# Patient Record
Sex: Male | Born: 1970
Health system: Southern US, Community
[De-identification: ages and names within clinical notes are randomized; demographics above are authoritative.]

## PROBLEM LIST (undated history)

## (undated) DIAGNOSIS — T7840XA Allergy, unspecified, initial encounter: Secondary | ICD-10-CM

## (undated) DIAGNOSIS — J309 Allergic rhinitis, unspecified: Secondary | ICD-10-CM

## (undated) DIAGNOSIS — I10 Essential (primary) hypertension: Secondary | ICD-10-CM

## (undated) HISTORY — DX: Allergy, unspecified, initial encounter: T78.40XA

## (undated) HISTORY — DX: Essential (primary) hypertension: I10

## (undated) HISTORY — DX: Allergic rhinitis, unspecified: J30.9

---

## 2012-02-04 ENCOUNTER — Ambulatory Visit (INDEPENDENT_AMBULATORY_CARE_PROVIDER_SITE_OTHER): Payer: BC Managed Care – PPO | Admitting: Physician Assistant

## 2012-02-04 ENCOUNTER — Encounter: Payer: Self-pay | Admitting: Physician Assistant

## 2012-02-04 VITALS — BP 141/85 | HR 86 | Temp 98.1°F | Resp 16 | Ht 70.0 in | Wt 177.0 lb

## 2012-02-04 DIAGNOSIS — Z0000 Encounter for general adult medical examination without abnormal findings: Secondary | ICD-10-CM

## 2012-02-04 LAB — COMPREHENSIVE METABOLIC PANEL
ALT: 15 U/L (ref 0–53)
AST: 18 U/L (ref 0–37)
Albumin: 4.3 g/dL (ref 3.5–5.2)
Alkaline Phosphatase: 81 U/L (ref 39–117)
Calcium: 8.6 mg/dL (ref 8.4–10.5)
Chloride: 105 mEq/L (ref 96–112)
Potassium: 4.3 mEq/L (ref 3.5–5.3)
Sodium: 141 mEq/L (ref 135–145)

## 2012-02-04 LAB — POCT URINALYSIS DIPSTICK
Bilirubin, UA: NEGATIVE
Ketones, UA: NEGATIVE
Leukocytes, UA: NEGATIVE
Nitrite, UA: NEGATIVE
Protein, UA: NEGATIVE
pH, UA: 7

## 2012-02-04 LAB — POCT UA - MICROSCOPIC ONLY
Crystals, Ur, HPF, POC: NEGATIVE
RBC, urine, microscopic: NEGATIVE
WBC, Ur, HPF, POC: NEGATIVE
Yeast, UA: NEGATIVE

## 2012-02-04 LAB — LIPID PANEL
LDL Cholesterol: 98 mg/dL (ref 0–99)
VLDL: 36 mg/dL (ref 0–40)

## 2012-02-04 LAB — TSH: TSH: 2.487 u[IU]/mL (ref 0.350–4.500)

## 2012-02-04 NOTE — Progress Notes (Signed)
Patient ID: Danny Garza MRN: [id number], DOB: [birth year] 41 y.o. Date of Encounter: 02/04/2012, 3:37 PM  Primary Physician: No primary provider on file.  Chief Complaint: Physical (CPE)  HPI: 41 y.o. y/o male with history noted below here for CPE.  Doing well. No issues/complaints. Last CPE years ago.  See scanned in blue sheet. Mentions that his BP has slowly been increasing. He was at his dentist a month or two ago and was told that his BP was 130's/90. Today he comes in with a triage BP in the 140's/80's. No CP, vision changes, or focal deficits. He does eat a lot of sauces and cold cut meats that may contribute some to this. Lately he has not had time to exercise secondary to his busy school schedule. He does plan to start exercising again to both help with his BP and secondary to having more time outside of work.  Last tetanus 2007, declines update today. He does mention a slight increase in his allergies over the past week or two. This has caused a mild HA that is resolved with Tylenol or Motrin. He plans to start a daily Zyrtec to help.  Review of Systems: Consitutional: No fever, chills, fatigue, night sweats, lymphadenopathy, or weight changes. Eyes: No visual changes, eye redness, or discharge. ENT/Mouth: Ears: No otalgia, tinnitus, hearing loss, discharge. Nose: No congestion, rhinorrhea, sinus pain, or epistaxis. Throat: No sore throat, post nasal drip, or teeth pain. Cardiovascular: No CP, palpitations, diaphoresis, DOE, edema, orthopnea, PND. Respiratory: No cough, hemoptysis, SOB, or wheezing. Gastrointestinal: No anorexia, dysphagia, reflux, pain, nausea, vomiting, hematemesis, diarrhea, constipation, BRBPR, or melena. Genitourinary: No dysuria, frequency, urgency, hematuria, incontinence, nocturia, decreased urinary stream, discharge, impotence, or testicular pain/masses. Musculoskeletal: No decreased ROM, myalgias, stiffness, joint swelling, or weakness. Skin: No rash,  erythema, lesion changes, pain, warmth, jaundice, or pruritis. Neurological: No headache, dizziness, syncope, seizures, tremors, memory loss, coordination problems, or paresthesias. Psychological: No anxiety, depression, hallucinations, SI/HI. Endocrine: No fatigue, polydipsia, polyphagia, polyuria, or known diabetes. All other systems were reviewed and are otherwise negative.  History reviewed. No pertinent past medical history.   History reviewed. No pertinent past surgical history.  Home Meds:  Prior to Admission medications   Not on File    Allergies: No Known Allergies  History   Social History  . Marital Status: No    Spouse Name: N/A    Number of Children: 0  . Years of Education: Other   Occupational History  . Not on file.   Social History Main Topics  . Smoking status: Never Smoker   . Smokeless tobacco: Never Used  . Alcohol Use: 0.5 oz/week    1 drink(s) per week  . Drug Use: No  . Sexually Active: Not Currently   Other Topics Concern  . Not on file   Social History Narrative  . No narrative on file    Family History  Problem Relation Age of Onset  . Cancer Father     throat secondary to tobacco use  . Hypertension Maternal Grandmother     Physical Exam: Blood pressure 141/85, pulse 86, temperature 98.1 F (36.7 C), temperature source Oral, resp. rate 16, height 5\' 10"  (1.778 m), weight 177 lb (80.287 kg). BP recheck 136/88 General: Well developed, well nourished, in no acute distress. HEENT: Normocephalic, atraumatic. Conjunctiva pink, sclera non-icteric. Pupils 2 mm constricting to 1 mm, round, regular, and equally reactive to light and accomodation. EOMI. Internal auditory canal clear. TMs with good cone of light  and without pathology. Nasal mucosa pink. Nares are without discharge. No sinus tenderness. Oral mucosa pink. Dentition with braces. Pharynx without exudate.   Neck: Supple. Trachea midline. No thyromegaly. Full ROM. No  lymphadenopathy. Lungs: Clear to auscultation bilaterally without wheezes, rales, or rhonchi. Breathing is of normal effort and unlabored. Cardiovascular: RRR with S1 S2. No murmurs, rubs, or gallops appreciated. Distal pulses 2+ symmetrically. No carotid or abdominal bruits. Abdomen: Soft, non-tender, non-distended with normoactive bowel sounds. No hepatosplenomegaly or masses. No rebound/guarding. No CVA tenderness. Without hernias.  Rectal: No external hemorrhoids or fissures. Rectal vault without masses. Prostate symmetrical without nodules.  Genitourinary: Circumcised male. No penile lesions. Testes descended bilaterally, and smooth without tenderness or masses.  Musculoskeletal: Full range of motion and 5/5 strength throughout. Without swelling, atrophy, tenderness, crepitus, or warmth. Extremities without clubbing, cyanosis, or edema. Calves supple. Skin: Warm and moist without erythema, ecchymosis, wounds, or rash. Neuro: A+Ox3. CN II-XII grossly intact. Moves all extremities spontaneously. Full sensation throughout. Normal gait. DTR 2+ throughout upper and lower extremities. Finger to nose intact. Psych:  Responds to questions appropriately with a normal affect.   Studies: CBC, CMET, Lipid, PSA, TSH, Vitamin D all pending. Patient is non-fasting. Results for orders placed in visit on 02/04/12  POCT UA - MICROSCOPIC ONLY      Component Value Range   WBC, Ur, HPF, POC neg     RBC, urine, microscopic neg     Bacteria, U Microscopic neg     Mucus, UA neg     Epithelial cells, urine per micros neg     Crystals, Ur, HPF, POC neg     Casts, Ur, LPF, POC neg     Yeast, UA neg    POCT URINALYSIS DIPSTICK      Component Value Range   Color, UA yellow     Clarity, UA clear     Glucose, UA neg     Bilirubin, UA neg     Ketones, UA neg     Spec Grav, UA 1.020     Blood, UA neg     pH, UA 7.0     Protein, UA neg     Urobilinogen, UA 1.0     Nitrite, UA neg     Leukocytes, UA Negative        Assessment/Plan:  41 y.o. y/o Caucasian male here for CPE with borderline HTN and allergic rhinitis 1. Borderline HTN -Patient will watch closely for the next 1-2 months -Work on improving diet and exercise -Recheck 1-2 months, if still elevated plan to start low lost HCTZ or Lisinopril  -Risks of HTN discussed in detail with patient today  2. CPE -Tetanus up to date -Await labs  3. Allergic rhinitis -Start Zyrtec daily -Recheck along with HTN 1-2 months -May need to add daily Flonase  Signed, Eula Listen, PA-C 02/04/2012 3:37 PM

## 2012-02-05 LAB — CBC
Platelets: 298 10*3/uL (ref 150–400)
RDW: 13.6 % (ref 11.5–15.5)
WBC: 6.7 10*3/uL (ref 4.0–10.5)

## 2012-04-07 ENCOUNTER — Encounter: Payer: Self-pay | Admitting: Physician Assistant

## 2012-04-07 ENCOUNTER — Ambulatory Visit (INDEPENDENT_AMBULATORY_CARE_PROVIDER_SITE_OTHER): Payer: BC Managed Care – PPO | Admitting: Physician Assistant

## 2012-04-07 VITALS — BP 141/96 | HR 69 | Temp 97.6°F | Resp 16 | Ht 70.0 in | Wt 177.0 lb

## 2012-04-07 DIAGNOSIS — R030 Elevated blood-pressure reading, without diagnosis of hypertension: Secondary | ICD-10-CM

## 2012-04-07 NOTE — Progress Notes (Signed)
Patient ID: Danny Garza MRN: [id number], DOB: 30-Nov-1970, 41 y.o. Date of Encounter: 04/07/2012, 1:01 PM  Primary Physician: No primary provider on file.  Chief Complaint: HTN  HPI: 41 y.o. year old male with history below presents for elevated blood pressure follow up. Doing well. No issues or complaints. Working out at [employer] three times per week. Eating healthier. Making his own sauces. Has cut out cold cuts. No sodas. [place] fries are his weakness. No CP, HA, visual changes, or focal deficits. His grandparents have history of HTN, but not his parents. At this time he would still like hold off antihypertensives and attempt to control his BP with lifestyle changes.  No past medical history on file.   Home Meds: Prior to Admission medications   Medication Sig Start Date End Date Taking? Authorizing Provider  cetirizine (ZYRTEC) 10 MG tablet Take 10 mg by mouth daily.   Yes Historical Provider, MD    Allergies: No Known Allergies  History   Social History  . Marital Status: Unknown    Spouse Name: N/A    Number of Children: N/A  . Years of Education: N/A   Occupational History  . Not on file.   Social History Main Topics  . Smoking status: Never Smoker   . Smokeless tobacco: Never Used  . Alcohol Use: 0.5 oz/week    1 drink(s) per week  . Drug Use: No  . Sexually Active: Not Currently   Other Topics Concern  . Not on file   Social History Narrative  . No narrative on file     Family History  Problem Relation Age of Onset  . Cancer Father     throat secondary to tobacco use  . Hypertension Maternal Grandmother     Review of Systems: Constitutional: negative for chills, fever, night sweats, weight changes, or fatigue  HEENT: negative for vision changes, hearing loss, congestion, rhinorrhea, ST, epistaxis, or sinus pressure Cardiovascular: negative for chest pain, palpitations, or DOE Respiratory: negative for hemoptysis, wheezing, shortness of breath, or  cough Abdominal: negative for abdominal pain, nausea, vomiting, diarrhea, or constipation Dermatological: negative for rash Neurologic: negative for headache, dizziness, or syncope All other systems reviewed and are otherwise negative with the exception to those above and in the HPI.   Physical Exam: Blood pressure 141/96, pulse 69, temperature 97.6 F (36.4 C), temperature source Oral, resp. rate 16, height 5\' 10"  (1.778 m), weight 177 lb (80.287 kg)., Body mass index is 25.40 kg/(m^2). General: Well developed, well nourished, in no acute distress. Head: Normocephalic, atraumatic, eyes without discharge, sclera non-icteric, nares are without discharge. Bilateral auditory canals clear, TM's are without perforation, pearly grey and translucent with reflective cone of light bilaterally. Oral cavity moist, posterior pharynx without exudate, erythema, peritonsillar abscess, or post nasal drip.  Neck: Supple. No thyromegaly. Full ROM. No lymphadenopathy. No carotid bruits. Lungs: Clear bilaterally to auscultation without wheezes, rales, or rhonchi. Breathing is unlabored. Heart: RRR with S1 S2. No murmurs, rubs, or gallops appreciated.  Abdomen: Soft, non-tender, non-distended with normoactive bowel sounds. No hepatosplenomegaly. No rebound/guarding. No obvious abdominal masses. Msk:  Strength and tone normal for age. Extremities/Skin: Warm and dry. No clubbing or cyanosis. No edema. No rashes or suspicious lesions. Distal pulses 2+ and equal bilaterally. Neuro: Alert and oriented X 3. Moves all extremities spontaneously. Gait is normal. CNII-XII grossly in tact. DTR 2+, cerebellar function intact. Rhomberg normal. Psych:  Responds to questions appropriately with a normal affect.    ASSESSMENT  AND PLAN:  41 y.o. year old male with elevated BP. -Continue lifestyle changes -Declines antihypertensive treatment at this time -Healthy diet and exercise -Cut out french fries -Recheck 3-4 months -If  BP remains the same needs to start antihypertensives  Signed, Eula Listen, PA-C 04/07/2012 1:01 PM

## 2012-07-21 ENCOUNTER — Encounter: Payer: Self-pay | Admitting: Physician Assistant

## 2012-07-21 ENCOUNTER — Ambulatory Visit (INDEPENDENT_AMBULATORY_CARE_PROVIDER_SITE_OTHER): Payer: BC Managed Care – PPO | Admitting: Physician Assistant

## 2012-07-21 VITALS — BP 124/94 | HR 77 | Temp 98.2°F | Resp 16 | Ht 71.0 in | Wt 176.8 lb

## 2012-07-21 DIAGNOSIS — J309 Allergic rhinitis, unspecified: Secondary | ICD-10-CM

## 2012-07-21 DIAGNOSIS — I10 Essential (primary) hypertension: Secondary | ICD-10-CM

## 2012-07-21 MED ORDER — HYDROCHLOROTHIAZIDE 12.5 MG PO CAPS: 12.5000 mg | ORAL_CAPSULE | ORAL | Status: DC

## 2012-07-21 NOTE — Progress Notes (Signed)
Patient ID: Danny Garza MRN: [id number], DOB: 1970/11/14, 41 y.o. Date of Encounter: 07/21/2012, 1:25 PM  Primary Physician: No primary provider on file.  Chief Complaint: HTN  HPI: 41 y.o. year old male with history below presents for hypertension follow up. See previous office visits. Doing well. No issues or complaints. Has continued to eat healthy. Not exercising as much, as school is back now. Has cut down on his french fries. No CP, HA, visual changes, or focal deficits. Seems to be a little more open to starting BP medication today as his BP is remaining about the same over these visits.     Also mentions that Zyrtec seems to keep him awake when he takes this. Does not take this daily, only prn.      No past medical history on file.   Home Meds: Prior to Admission medications   Medication Sig Start Date End Date Taking? Authorizing Provider  cetirizine (ZYRTEC) 10 MG tablet Take 10 mg by mouth daily.    Historical Provider, MD    Allergies: No Known Allergies  History   Social History  . Marital Status: Unknown    Spouse Name: N/A    Number of Children: N/A  . Years of Education: N/A   Occupational History  . Not on file.   Social History Main Topics  . Smoking status: Never Smoker   . Smokeless tobacco: Never Used  . Alcohol Use: 0.5 oz/week    1 drink(s) per week  . Drug Use: No  . Sexually Active: Not Currently   Other Topics Concern  . Not on file   Social History Narrative  . No narrative on file     Family History  Problem Relation Age of Onset  . Cancer Father     throat secondary to tobacco use  . Hypertension Maternal Grandmother     Review of Systems: Constitutional: negative for chills, fever, night sweats, weight changes, or fatigue  HEENT: negative for vision changes, hearing loss, congestion, rhinorrhea, ST, epistaxis, or sinus pressure Cardiovascular: negative for chest pain, palpitations, or DOE Respiratory: negative for  hemoptysis, wheezing, shortness of breath, or cough Abdominal: negative for abdominal pain, nausea, vomiting, diarrhea, or constipation Dermatological: negative for rash Neurologic: negative for headache, dizziness, or syncope All other systems reviewed and are otherwise negative with the exception to those above and in the HPI.   Physical Exam: Blood pressure 124/94, pulse 77, temperature 98.2 F (36.8 C), temperature source Oral, resp. rate 16, height 5\' 11"  (1.803 m), weight 176 lb 12.8 oz (80.196 kg), SpO2 98.00%., Body mass index is 24.66 kg/(m^2). General: Well developed, well nourished, in no acute distress. Head: Normocephalic, atraumatic, eyes without discharge, sclera non-icteric, nares are without discharge. Bilateral auditory canals clear, TM's are without perforation, pearly grey and translucent with reflective cone of light bilaterally. Oral cavity moist, posterior pharynx without exudate, erythema, peritonsillar abscess, or post nasal drip.  Neck: Supple. No thyromegaly. Full ROM. No lymphadenopathy. No carotid bruits. Lungs: Clear bilaterally to auscultation without wheezes, rales, or rhonchi. Breathing is unlabored. Heart: RRR with S1 S2. No murmurs, rubs, or gallops appreciated.  Msk:  Strength and tone normal for age. Extremities/Skin: Warm and dry. No clubbing or cyanosis. No edema. No rashes or suspicious lesions. Distal pulses 2+ and equal bilaterally. Neuro: Alert and oriented X 3. Moves all extremities spontaneously. Gait is normal. CNII-XII grossly in tact. DTR 2+, cerebellar function intact. Rhomberg normal. Psych:  Responds to questions appropriately with a  normal affect.     ASSESSMENT AND PLAN:  41 y.o. year old male with HTN and allergic rhinitis. 1. HTN -Start HCTZ 12.5 mg 1 po daily #30 RF 1 -Healthy diet and exercise -Weight loss -Likely some family history contributing here as well -Patient declines flu vaccine in office today, will get at  work. -Recheck one month, with labs  2. Allergic rhinitis -Stop Zyrtec -Trial of Claritin or Allegra prn -If unable to tolerate can call for Singulair  Signed, Eula Listen, PA-C 07/21/2012 1:25 PM

## 2012-08-25 ENCOUNTER — Encounter: Payer: Self-pay | Admitting: Physician Assistant

## 2012-08-25 ENCOUNTER — Ambulatory Visit (INDEPENDENT_AMBULATORY_CARE_PROVIDER_SITE_OTHER): Payer: BC Managed Care – PPO | Admitting: Physician Assistant

## 2012-08-25 VITALS — BP 120/82 | HR 79 | Temp 97.8°F | Resp 16 | Ht 70.0 in | Wt 173.0 lb

## 2012-08-25 DIAGNOSIS — I10 Essential (primary) hypertension: Secondary | ICD-10-CM

## 2012-08-25 DIAGNOSIS — J309 Allergic rhinitis, unspecified: Secondary | ICD-10-CM

## 2012-08-25 MED ORDER — FLUTICASONE PROPIONATE 50 MCG/ACT NA SUSP: 2.0000 | Freq: Every day | NASAL | Status: DC

## 2012-08-25 MED ORDER — HYDROCHLOROTHIAZIDE 12.5 MG PO CAPS: 12.5000 mg | ORAL_CAPSULE | ORAL | Status: DC

## 2012-08-25 MED ORDER — MONTELUKAST SODIUM 10 MG PO TABS: 10.0000 mg | ORAL_TABLET | Freq: Every day | ORAL | Status: DC

## 2012-08-25 NOTE — Progress Notes (Signed)
Patient ID: Danny Garza MRN: [id number], DOB: [birth year], 41 y.o. Date of Encounter: 08/25/2012, 1:43 PM  Primary Physician: No primary provider on file.  Chief Complaint: HTN  HPI: 41 y.o. year old male with history below presents for hypertension follow up. Doing well. Started on HCTZ 12.5 mg at last office visit on 07/21/12. Blood pressure has improved. Did notice that when he went to see his dentist his BP seemed to be somewhat high, but this did improve after his procedure with them. Tolerating his blood pressure medication without issue. No headache, chest pain, vision changes, or focal deficits. Eating healthy. No french fries. Still not finding time to exercise. Not really finding time for himself, and this is stressing him out. Everything he does lately is related to work. He would like to just have some time for himself, but can't seem to find this. Has seen a counselor before for his stress and is considering seeing her again. No SI or HI.   He also mentions continued on and off flares of his allergies. Seems to develop some nasal congestion, itchy watery eyes, post nasal drip, and chest congestion. The chest congestion is what bothers him the most. He will sometimes cough up some of the phlegm. Has taken Zyrtec on and off and mixed results. He wonders if there is anything else out there that might help him.     No past medical history on file.   Home Meds: Prior to Admission medications   Medication Sig Start Date End Date Taking? Authorizing Provider  cetirizine (ZYRTEC) 10 MG tablet Take 10 mg by mouth daily.   Yes Historical Provider, MD  hydrochlorothiazide (MICROZIDE) 12.5 MG capsule Take 1 capsule (12.5 mg total) by mouth every morning. 08/25/12  Yes Dorisann Schwanke M Katherine Tout, PA-C                  Allergies: No Known Allergies  History   Social History  . Marital Status: Unknown    Spouse Name: N/A    Number of Children: N/A  . Years of Education: N/A   Occupational History  .  Not on file.   Social History Main Topics  . Smoking status: Never Smoker   . Smokeless tobacco: Never Used  . Alcohol Use: 0.5 oz/week    1 drink(s) per week  . Drug Use: No  . Sexually Active: Not Currently   Other Topics Concern  . Not on file   Social History Narrative  . No narrative on file     Family History  Problem Relation Age of Onset  . Cancer Father     throat secondary to tobacco use  . Hypertension Maternal Grandmother     Review of Systems: Constitutional: negative for chills, fever, night sweats, weight changes, or fatigue  HEENT: negative for vision changes, hearing loss, ST, epistaxis, or sinus pressure Cardiovascular: negative for chest pain, palpitations, or DOE Respiratory: negative for hemoptysis, wheezing, or shortness of breath Abdominal: negative for abdominal pain, nausea, vomiting, diarrhea, or constipation Dermatological: negative for rash Neurologic: negative for headache, dizziness, or syncope All other systems reviewed and are otherwise negative with the exception to those above and in the HPI.   Physical Exam: Blood pressure 120/82, pulse 79, temperature 97.8 F (36.6 C), temperature source Oral, resp. rate 16, height 5\' 10"  (1.778 m), weight 173 lb (78.472 kg), SpO2 98.00%., Body mass index is 24.82 kg/(m^2). General: Well developed, well nourished, in no acute distress. Head: Normocephalic, atraumatic, eyes without  discharge, sclera non-icteric, nares are without discharge. Bilateral auditory canals clear, TM's are without perforation, pearly grey and translucent with reflective cone of light bilaterally. Oral cavity moist, posterior pharynx without exudate, erythema, peritonsillar abscess, or post nasal drip.  Neck: Supple. No thyromegaly. Full ROM. No lymphadenopathy. No carotid bruits. Lungs: Clear bilaterally to auscultation without wheezes, rales, or rhonchi. Breathing is unlabored. Heart: RRR with S1 S2. No murmurs, rubs, or gallops  appreciated.  Msk:  Strength and tone normal for age. Extremities/Skin: Warm and dry. No clubbing or cyanosis. No edema. No rashes or suspicious lesions. Distal pulses 2+ and equal bilaterally. Neuro: Alert and oriented X 3. Moves all extremities spontaneously. Gait is normal. CNII-XII grossly in tact. DTR 2+, cerebellar function intact. Rhomberg normal. Psych:  Responds to questions appropriately with a normal affect.   Labs:  BMP pending  ASSESSMENT AND PLAN:  41 y.o. year old male with improved hypertension and allergic rhinitis.  1. Hypertension -Improved -Continue current treatment -Refilled HCTZ 12.5 mg 1 po daily #30 RF 6 -Healthy diet -Must find time to exercise -Must find time for himself -See his counselor to help discuss some of his stressors, offered him to call me anytime for help, he agrees -No SI/HI -No depression, just stress  2. Allergic rhinitis -Add Flonase 2 sprays each nare daily #1 RF 3 -Add Singulair 10 mg 1 po qhs #30 RF 6 -Continue Zyrtec -Call with results  3. Follow up April 2014  Valaria Good 08/25/2012 1:43 PM

## 2012-08-26 LAB — BASIC METABOLIC PANEL
CO2: 28 mEq/L (ref 19–32)
Calcium: 9.3 mg/dL (ref 8.4–10.5)
Creat: 1.06 mg/dL (ref 0.50–1.35)
Sodium: 138 mEq/L (ref 135–145)

## 2012-09-02 ENCOUNTER — Ambulatory Visit: Payer: BC Managed Care – PPO

## 2012-09-02 ENCOUNTER — Ambulatory Visit (INDEPENDENT_AMBULATORY_CARE_PROVIDER_SITE_OTHER): Payer: BC Managed Care – PPO | Admitting: Physician Assistant

## 2012-09-02 VITALS — BP 118/76 | HR 99 | Temp 98.1°F | Resp 18 | Ht 70.0 in | Wt 173.0 lb

## 2012-09-02 DIAGNOSIS — R202 Paresthesia of skin: Secondary | ICD-10-CM

## 2012-09-02 DIAGNOSIS — M62838 Other muscle spasm: Secondary | ICD-10-CM

## 2012-09-02 DIAGNOSIS — R209 Unspecified disturbances of skin sensation: Secondary | ICD-10-CM

## 2012-09-02 DIAGNOSIS — M47812 Spondylosis without myelopathy or radiculopathy, cervical region: Secondary | ICD-10-CM

## 2012-09-02 MED ORDER — ALPRAZOLAM 0.5 MG PO TABS: 0.5000 mg | ORAL_TABLET | Freq: Every evening | ORAL | Status: DC | PRN

## 2012-09-02 MED ORDER — PREDNISONE 20 MG PO TABS: ORAL_TABLET | ORAL | Status: DC

## 2012-09-02 NOTE — Progress Notes (Signed)
Patient ID: Danny Garza MRN: [id number], DOB: 04/27/1971, 41 y.o. Date of Encounter: 09/02/2012, 2:58 PM  Primary Physician: Carola Frost  Chief Complaint: Paresthesia left forearm and hand  HPI: 41 y.o. year old male with history below presents with onset of numbness and tingling of the dorsal surface of the left forearm just distal to the elbow extending into the hand and 5th digit. Symptoms began around 3 AM this morning causing him to wake from sleep. After about an hour the numbness and tingling lessened some allowing for him to fall back asleep around 4 AM. When he woke for good this morning he still had the numbness and tingling along the dorsal forearm and hand. His symptoms will sometimes move from the forearm to only involve the dorsal hand or only the forearm. His 5th digit is usually involved, but he thinks that sometimes others may feel numb also. He states around lunch time today the numbness and tingling again worsened to the level that woke him at 3 AM prompting him to come in for evaluation shortly after. He does say that his vision was blurry right when he woke at 3 AM, but then subsequently cleared upon further wakening, and has not been blurry since. Currently he complains of numbness and tingling of the dorsal forearm just distal to the elbow extending into the dorsal hand. He also mentions some associated left frontal headaches that have been waxing and waning for about the past week. Has had some left sided neck pain since the above symptoms as well. When he went to bed last night he was without symptoms and felt fine. He denies any chest pain, palpitations, diaphoresis, nausea, vomiting, slurred speech, weakness, hearing changes, diplopia, gait changes, or rashes. He was started on HCTZ 12.5 mg daily on 07/21/12 and has been tolerating this well. Started taking Singulair 10 mg qhs and Flonase daily on 08/25/12.   His mother had a CABG in her 5's. Otherwise no significant  cardiac family history.    Past Medical History  Diagnosis Date  . HTN (hypertension)   . Allergic rhinitis      Home Meds: Prior to Admission medications   Medication Sig Start Date End Date Taking? Authorizing Provider  cetirizine (ZYRTEC) 10 MG tablet Take 10 mg by mouth daily.   Yes Historical Provider, MD  fluticasone (FLONASE) 50 MCG/ACT nasal spray Place 2 sprays into the nose daily. 08/25/12  Yes Samik Balkcom M Trayvion Embleton, PA-C  hydrochlorothiazide (MICROZIDE) 12.5 MG capsule Take 1 capsule (12.5 mg total) by mouth every morning. 08/25/12  Yes Dontray Haberland M Caylea Foronda, PA-C  montelukast (SINGULAIR) 10 MG tablet Take 1 tablet (10 mg total) by mouth at bedtime. 08/25/12  Yes Damarea Merkel M Markevious Ehmke, PA-C    Allergies: No Known Allergies  History   Social History  . Marital Status: Unknown    Spouse Name: N/A    Number of Children: N/A  . Years of Education: N/A   Occupational History  . Not on file.   Social History Main Topics  . Smoking status: Never Smoker   . Smokeless tobacco: Never Used  . Alcohol Use: 0.5 oz/week    1 drink(s) per week  . Drug Use: No  . Sexually Active: Not Currently   Other Topics Concern  . Not on file   Social History Narrative  . No narrative on file     Review of Systems: Constitutional: negative for chills, fever, night sweats, weight changes, or fatigue  HEENT: negative for vision  changes, hearing loss, congestion, rhinorrhea, ST, epistaxis, or sinus pressure Cardiovascular: negative for chest pain or palpitations Respiratory: negative for hemoptysis, wheezing, shortness of breath, or cough Abdominal: negative for abdominal pain, nausea, vomiting, diarrhea, or constipation Dermatological: negative for rash Neurologic: Positive for paresthesia. Negative for dizziness or syncope   Physical Exam: Blood pressure 118/76, pulse 99, temperature 98.1 F (36.7 C), temperature source Oral, resp. rate 18, height 5\' 10"  (1.778 m), weight 173 lb (78.472 kg), SpO2 97.00%.,  Body mass index is 24.82 kg/(m^2). General: Well developed, well nourished, in no acute distress. Head: Normocephalic, atraumatic, eyes without discharge, sclera non-icteric, nares are without discharge. Bilateral auditory canals clear, TM's are without perforation, pearly grey and translucent with reflective cone of light bilaterally. Oral cavity moist, posterior pharynx without exudate, erythema, peritonsillar abscess, or post nasal drip.  Neck: Supple. No thyromegaly. Full ROM. No lymphadenopathy. No carotid bruits bilaterally. No midline C spin TTP.  Lungs: Clear bilaterally to auscultation without wheezes, rales, or rhonchi. Breathing is unlabored. Heart: RRR with S1 S2. No murmurs, rubs, or gallops appreciated. Msk:  Strength and tone normal for age. Extremities/Skin: Warm and dry. No clubbing or cyanosis. No edema. No rashes or suspicious lesions. 5/5 strength through out all extremities. Equal grip strength.  Neuro: Alert and oriented X 3. Moves all extremities spontaneously. Gait is normal. CNII-XII grossly in tact. Sharp/dull and light touch intact bilateral upper extremities. Normal Babinski's. Normal Rhomberg. Able to stand on each leg by it's self for 10 seconds with ease. Normal cerebellar function.     Psych:  Responds to questions appropriately with a normal affect.   Labs: BMP pending  EKG: NSR read by Dr. Perrin Maltese  C spine [facility] reading (PRIMARY) by  Dr. Perrin Maltese. Mild diffuse degenerative changes with spurring. No acute step off or fracture.    ASSESSMENT AND PLAN:  41 y.o. year old male with paresthesias of the left dorsal forearm and hand secondary to C spine degenerative changes and headache. 1. Paresthesias -Secondary to C spine changes -No worrisome physical exam findings today -Prednisone 20 mg #18 3x3, 2x3, 1x3 no RF -Xanax 0.5 1 po qhs prn #20 no RF SED -Neck strengthening exercises given -Patient to call once completed prednisone taper to update, sooner if worse    -RTC/ER precautions  2. Headache -Waxing and waning for one week -Likely secondary to his allergies -Prednisone may offer him some relief of this -Advised if he is still having headache in 24-48 hours he is to call and will proceed with further testing  3. Discussed with Dr. Perrin Maltese, agrees with above assessment and plan   Signed, Eula Listen, PA-C 09/02/2012 2:58 PM

## 2012-09-03 LAB — BASIC METABOLIC PANEL
BUN: 15 mg/dL (ref 6–23)
Calcium: 9.2 mg/dL (ref 8.4–10.5)
Chloride: 102 mEq/L (ref 96–112)
Creat: 0.95 mg/dL (ref 0.50–1.35)

## 2012-09-23 ENCOUNTER — Ambulatory Visit (INDEPENDENT_AMBULATORY_CARE_PROVIDER_SITE_OTHER): Payer: BC Managed Care – PPO | Admitting: Family Medicine

## 2012-09-23 VITALS — BP 120/86 | HR 77 | Temp 98.1°F | Resp 16 | Ht 71.0 in | Wt 172.0 lb

## 2012-09-23 DIAGNOSIS — H6120 Impacted cerumen, unspecified ear: Secondary | ICD-10-CM

## 2012-09-23 DIAGNOSIS — H9209 Otalgia, unspecified ear: Secondary | ICD-10-CM

## 2012-09-23 NOTE — Progress Notes (Signed)
Subjective:  41 year old patient with a discomfort in his right ear. Has not had a cold, sore throat, previous problems with ears particularly, recent flying, etc.  Objective: Left TM and telemetry normal Right TM is barely visible due to large plug of wax which is sitting in the ear canal. It looks hard. Neck was supple without nodes  Procedure note: Under direct visualization using a headlamp and curette the plug of wax was retrieved. A small amount of wax was removed from the superior portion of the canal. The patient tolerated this well. He says it feels better.  Assessment: Cerumen impaction right ear,  Plan; Use debrox prn

## 2012-09-23 NOTE — Patient Instructions (Signed)
Use Debrox about every 6 months to keep wax from building up in years.

## 2013-03-30 ENCOUNTER — Encounter: Payer: BC Managed Care – PPO | Admitting: Physician Assistant

## 2013-04-13 ENCOUNTER — Ambulatory Visit (INDEPENDENT_AMBULATORY_CARE_PROVIDER_SITE_OTHER): Payer: BC Managed Care – PPO | Admitting: Physician Assistant

## 2013-04-13 ENCOUNTER — Encounter: Payer: Self-pay | Admitting: Physician Assistant

## 2013-04-13 VITALS — BP 128/82 | HR 79 | Temp 98.0°F | Resp 16 | Ht 70.0 in | Wt 177.0 lb

## 2013-04-13 DIAGNOSIS — I10 Essential (primary) hypertension: Secondary | ICD-10-CM

## 2013-04-13 DIAGNOSIS — Z0000 Encounter for general adult medical examination without abnormal findings: Secondary | ICD-10-CM

## 2013-04-13 DIAGNOSIS — J309 Allergic rhinitis, unspecified: Secondary | ICD-10-CM

## 2013-04-13 LAB — COMPREHENSIVE METABOLIC PANEL
ALT: 17 U/L (ref 0–53)
AST: 18 U/L (ref 0–37)
Alkaline Phosphatase: 87 U/L (ref 39–117)
Sodium: 141 mEq/L (ref 135–145)
Total Bilirubin: 0.4 mg/dL (ref 0.3–1.2)
Total Protein: 6.8 g/dL (ref 6.0–8.3)

## 2013-04-13 LAB — POCT URINALYSIS DIPSTICK
Glucose, UA: NEGATIVE
Ketones, UA: NEGATIVE
Leukocytes, UA: NEGATIVE
Protein, UA: NEGATIVE

## 2013-04-13 LAB — CBC
MCH: 28.3 pg (ref 26.0–34.0)
MCHC: 35.3 g/dL (ref 30.0–36.0)
Platelets: 310 10*3/uL (ref 150–400)
RDW: 15.1 % (ref 11.5–15.5)

## 2013-04-13 LAB — POCT UA - MICROSCOPIC ONLY
Crystals, Ur, HPF, POC: NEGATIVE
WBC, Ur, HPF, POC: NEGATIVE

## 2013-04-13 LAB — LIPID PANEL
LDL Cholesterol: 135 mg/dL — ABNORMAL HIGH (ref 0–99)
Triglycerides: 89 mg/dL (ref <150)
VLDL: 18 mg/dL (ref 0–40)

## 2013-04-13 MED ORDER — HYDROCHLOROTHIAZIDE 12.5 MG PO CAPS: 12.5000 mg | ORAL_CAPSULE | ORAL | Status: DC

## 2013-04-13 MED ORDER — MONTELUKAST SODIUM 10 MG PO TABS: 10.0000 mg | ORAL_TABLET | Freq: Every day | ORAL | Status: DC

## 2013-04-13 NOTE — Progress Notes (Signed)
Patient ID: Midas Daughety MRN: [id number], DOB: April 25, 1971 42 y.o. Date of Encounter: 04/13/2013, 4:19 PM  Primary Physician: Carola Frost  Chief Complaint: Physical (CPE)  HPI: 42 y.o. male with history noted below here for CPE. Doing well. No issues/complaints.  1) Hypertension: Doing well. Currently taking HCTZ 12.5 mg daily without adverse effect. Needs refill. Healthy diet. Getting in some regular exercise. Joined an exercise class at the school. Feels good about where he is right now.   2) Allergic rhinitis: Doing well. Currently taking Zyrtec, and Singulair daily. No adverse effects. Needs refills.    3) CPE: Planning a trip to [place] in 2 days to visit with some friends for about 10 days. Taking some time out for himself. He is excited for this. Also recently went to [place]. Last tetanus 2007.   4) Paresthesias: Resolved.   Review of Systems: Consitutional: No fever, chills, fatigue, night sweats, lymphadenopathy, or weight changes. Eyes: No visual changes, eye redness, or discharge. ENT/Mouth: Ears: No otalgia, tinnitus, hearing loss, discharge. Nose: No congestion, rhinorrhea, sinus pain, or epistaxis. Throat: No sore throat, post nasal drip, or teeth pain. Cardiovascular: No CP, palpitations, diaphoresis, DOE, edema, orthopnea, PND. Respiratory: No cough, hemoptysis, SOB, or wheezing. Gastrointestinal: No anorexia, dysphagia, reflux, pain, nausea, vomiting, hematemesis, diarrhea, constipation, BRBPR, or melena. Genitourinary: No dysuria, frequency, urgency, hematuria, incontinence, nocturia, decreased urinary stream, discharge, impotence, or testicular pain/masses. Musculoskeletal: No decreased ROM, myalgias, stiffness, joint swelling, or weakness. Skin: No rash, erythema, lesion changes, pain, warmth, jaundice, or pruritis. Neurological: No headache, dizziness, syncope, seizures, tremors, memory loss, coordination problems, or paresthesias. Psychological: No anxiety,  depression, hallucinations, SI/HI. Endocrine: No fatigue, polydipsia, polyphagia, polyuria, or known diabetes.   Past Medical History  Diagnosis Date  . HTN (hypertension)   . Allergic rhinitis      No past surgical history on file.  Home Meds:  Prior to Admission medications   Medication Sig Start Date End Date Taking? Authorizing Provider         cetirizine (ZYRTEC) 10 MG tablet Take 10 mg by mouth daily.    Historical Provider, MD         hydrochlorothiazide (MICROZIDE) 12.5 MG capsule Take 1 capsule (12.5 mg total) by mouth every morning. 08/25/12   Kiaira Pointer M Cheyrl Buley, PA-C  montelukast (SINGULAIR) 10 MG tablet Take 1 tablet (10 mg total) by mouth at bedtime. 08/25/12   Sondra Barges, PA-C           Allergies: No Known Allergies  History   Social History  . Marital Status: Unknown    Spouse Name: N/A    Number of Children: N/A  . Years of Education: N/A   Occupational History  . Not on file.   Social History Main Topics  . Smoking status: Never Smoker   . Smokeless tobacco: Never Used  . Alcohol Use: 0.5 oz/week    1 drink(s) per week  . Drug Use: No  . Sexually Active: Not Currently   Other Topics Concern  . Not on file   Social History Narrative  . No narrative on file    Family History  Problem Relation Age of Onset  . Cancer Father     throat secondary to tobacco use  . Hypertension Maternal Grandmother   . Heart disease Mother     CABG in 87's    Physical Exam: Blood pressure 128/82, pulse 79, temperature 98 F (36.7 C), temperature source Oral, resp. rate 16, height 5\' 10"  (1.778  m), weight 177 lb (80.287 kg).  General: Well developed, well nourished, in no acute distress. HEENT: Normocephalic, atraumatic. Conjunctiva pink, sclera non-icteric. Pupils 2 mm constricting to 1 mm, round, regular, and equally reactive to light and accomodation. EOMI. Internal auditory canal clear. TMs with good cone of light and without pathology. Nasal mucosa pink. Nares are  without discharge. No sinus tenderness. Oral mucosa pink. Dentition normal. Pharynx without exudate.   Neck: Supple. Trachea midline. No thyromegaly. Full ROM. No lymphadenopathy. Lungs: Clear to auscultation bilaterally without wheezes, rales, or rhonchi. Breathing is of normal effort and unlabored. Cardiovascular: RRR with S1 S2. No murmurs, rubs, or gallops appreciated. Distal pulses 2+ symmetrically. No carotid or abdominal bruits. Abdomen: Soft, non-tender, non-distended with normoactive bowel sounds. No hepatosplenomegaly or masses. No rebound/guarding. No CVA tenderness. Without hernias.  Rectal: No external hemorrhoids or fissures. Rectal vault without masses. Prostate smooth, symmetrical, and without nodules or TTP.  Genitourinary: Circumcised male. No penile lesions. Testes descended bilaterally, and smooth without tenderness or masses.  Musculoskeletal: Full range of motion and 5/5 strength throughout. Without swelling, atrophy, tenderness, crepitus, or warmth. Extremities without clubbing, cyanosis, or edema. Calves supple. Skin: Warm and moist without erythema, ecchymosis, wounds, or rash. Neuro: A+Ox3. CN II-XII grossly intact. Moves all extremities spontaneously. Full sensation throughout. Normal gait. DTR 2+ throughout upper and lower extremities. Finger to nose intact. Psych:  Responds to questions appropriately with a normal affect.   Studies:  Results for orders placed in visit on 04/13/13  POCT UA - MICROSCOPIC ONLY      Result Value Range   WBC, Ur, HPF, POC neg     RBC, urine, microscopic 0-1     Bacteria, U Microscopic neg     Mucus, UA neg     Epithelial cells, urine per micros 0-1     Crystals, Ur, HPF, POC neg     Casts, Ur, LPF, POC neg     Yeast, UA neg    POCT URINALYSIS DIPSTICK      Result Value Range   Color, UA yellow     Clarity, UA clear     Glucose, UA neg     Bilirubin, UA neg     Ketones, UA neg     Spec Grav, UA 1.015     Blood, UA trace-intact      pH, UA 6.0     Protein, UA neg     Urobilinogen, UA 0.2     Nitrite, UA neg     Leukocytes, UA Negative      CBC, CMET, Lipid, PSA, TSH all pending. Patient is fasting.   Assessment/Plan:  42 y.o. male here for CPEwith hypertension and allergic rhinitis  1) Hypertension -Well controlled -Refilled HCTZ 12.5 mg 1 po daily #30 RF 11 -Healthy diet and exercise -Weight loss  2) Allergic rhinitis -Well controlled -Refilled Singulair 10 mg 1 po qhs #30 RF 11 -Continue Zyrtec  3) CPE -Healthy male CPE -Await labs -Anticipatory guidance -Continue to take time out for himself  Signed, Eula Listen, PA-C 04/13/2013 4:19 PM

## 2014-02-24 IMAGING — CR DG CERVICAL SPINE COMPLETE 4+V
5 series · 5 of 5 positions shown · non-contrast
Comparison: None.

CLINICAL DATA: Paresthesia

CERVICAL SPINE - COMPLETE 4+ VIEW

[lpo]
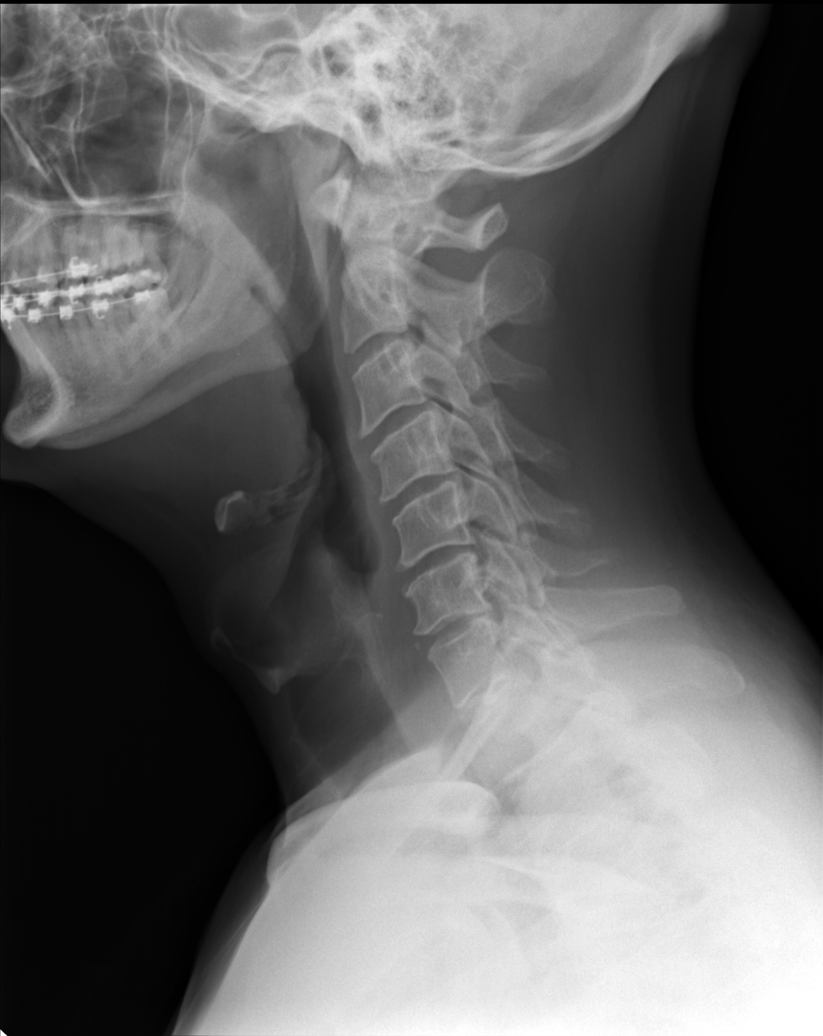

[lateral]
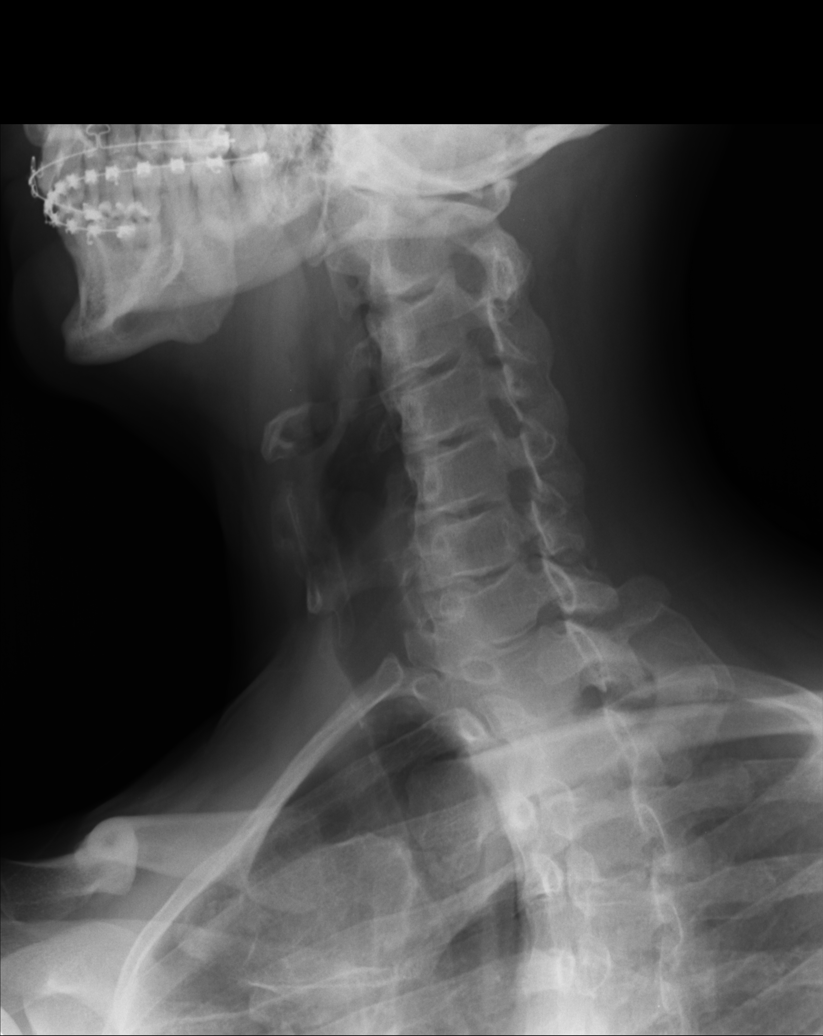

[rpo]
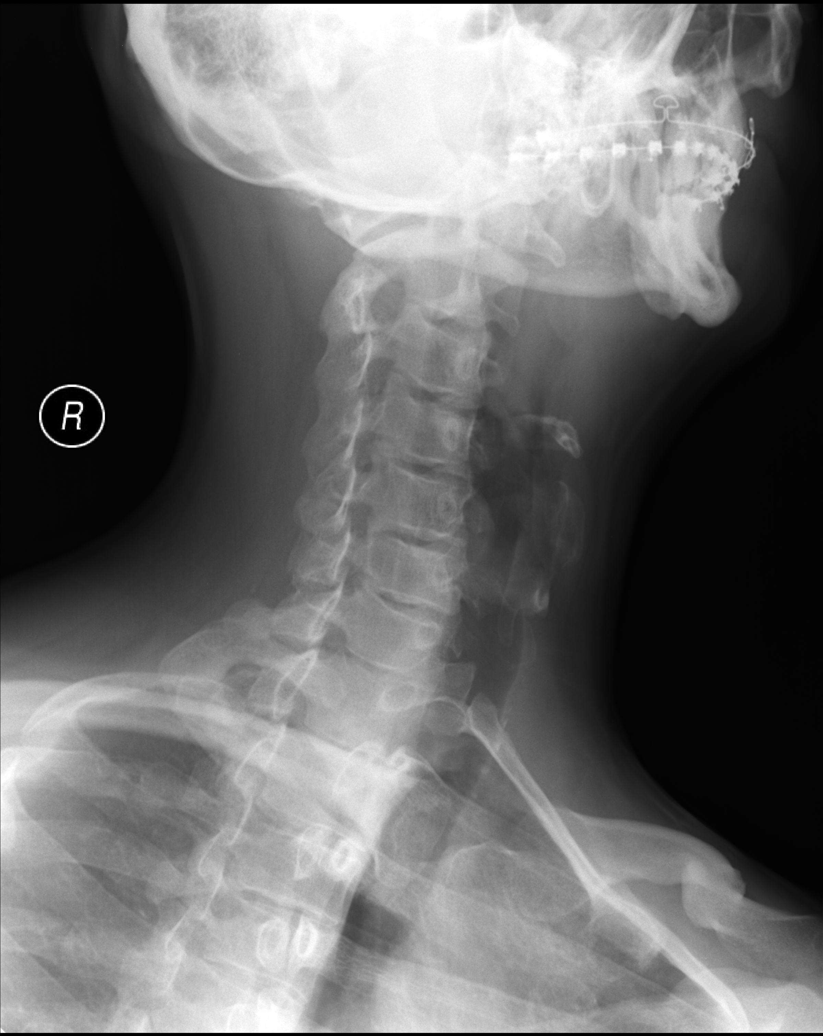

[AP]
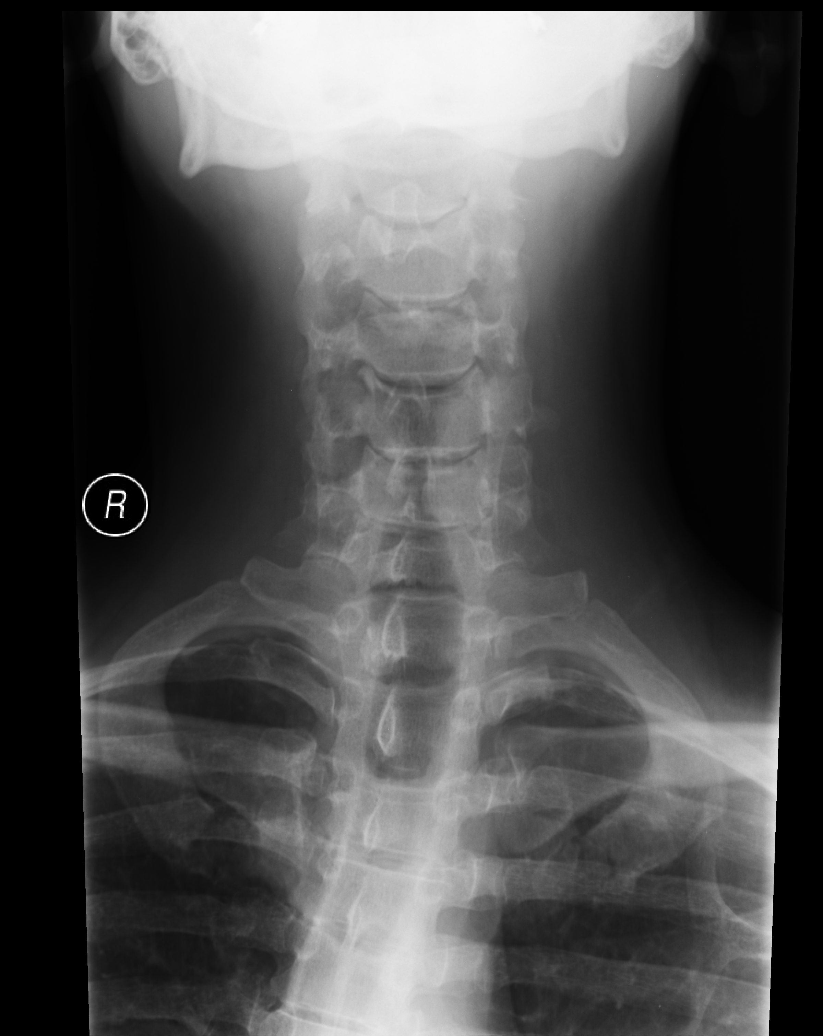

[ap open mouth]
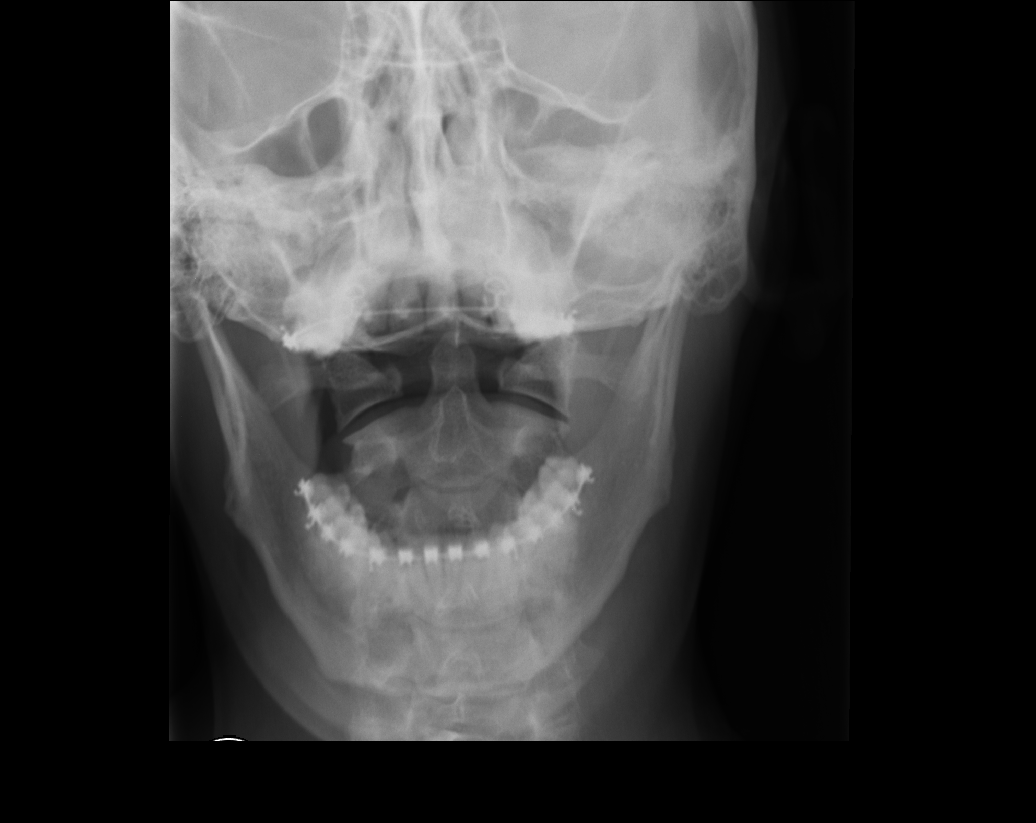

[5 of 5 positions shown; findings below may reference images not displayed]

FINDINGS: No fracture or spondylolisthesis.  Straightening of the
normal cervical lordosis.  There is mild loss of disc height at C4-
C5, C5-C6 and C6-C7.  There is mild uncovertebral spurring. There
is moderate neural foraminal narrowing on the right at C6-C7 with
mild neural foraminal narrowing on the right at C4-C5.  The
remaining neural foramina are well preserved.
IMPRESSION: Degenerative changes as detailed.

## 2014-03-22 ENCOUNTER — Ambulatory Visit (INDEPENDENT_AMBULATORY_CARE_PROVIDER_SITE_OTHER): Payer: BC Managed Care – PPO | Admitting: Physician Assistant

## 2014-03-22 ENCOUNTER — Encounter: Payer: Self-pay | Admitting: Physician Assistant

## 2014-03-22 VITALS — BP 132/78 | HR 73 | Temp 98.3°F | Resp 18 | Ht 70.0 in | Wt 183.0 lb

## 2014-03-22 DIAGNOSIS — J309 Allergic rhinitis, unspecified: Secondary | ICD-10-CM

## 2014-03-22 DIAGNOSIS — I10 Essential (primary) hypertension: Secondary | ICD-10-CM

## 2014-03-22 DIAGNOSIS — L0291 Cutaneous abscess, unspecified: Secondary | ICD-10-CM

## 2014-03-22 DIAGNOSIS — Z0000 Encounter for general adult medical examination without abnormal findings: Secondary | ICD-10-CM

## 2014-03-22 DIAGNOSIS — L0390 Cellulitis, unspecified: Secondary | ICD-10-CM

## 2014-03-22 LAB — POCT URINALYSIS DIPSTICK
Bilirubin, UA: NEGATIVE
GLUCOSE UA: NEGATIVE
KETONES UA: NEGATIVE
Leukocytes, UA: NEGATIVE
Nitrite, UA: NEGATIVE
Protein, UA: NEGATIVE
RBC UA: NEGATIVE
SPEC GRAV UA: 1.015
UROBILINOGEN UA: 0.2
pH, UA: 6

## 2014-03-22 LAB — CBC
HEMATOCRIT: 42.3 % (ref 39.0–52.0)
Hemoglobin: 14.5 g/dL (ref 13.0–17.0)
MCH: 26.1 pg (ref 26.0–34.0)
MCHC: 34.3 g/dL (ref 30.0–36.0)
MCV: 76.2 fL — AB (ref 78.0–100.0)
Platelets: 304 10*3/uL (ref 150–400)
RBC: 5.55 MIL/uL (ref 4.22–5.81)
RDW: 16.8 % — ABNORMAL HIGH (ref 11.5–15.5)
WBC: 6.5 10*3/uL (ref 4.0–10.5)

## 2014-03-22 LAB — LIPID PANEL
CHOL/HDL RATIO: 4.4 ratio
CHOLESTEROL: 184 mg/dL (ref 0–200)
HDL: 42 mg/dL (ref >39)
LDL Cholesterol: 123 mg/dL — ABNORMAL HIGH (ref 0–99)
Triglycerides: 94 mg/dL (ref <150)
VLDL: 19 mg/dL (ref 0–40)

## 2014-03-22 LAB — COMPREHENSIVE METABOLIC PANEL
ALT: 34 U/L (ref 0–53)
AST: 25 U/L (ref 0–37)
Albumin: 4.4 g/dL (ref 3.5–5.2)
Alkaline Phosphatase: 84 U/L (ref 39–117)
BILIRUBIN TOTAL: 0.3 mg/dL (ref 0.2–1.2)
BUN: 13 mg/dL (ref 6–23)
CALCIUM: 9.2 mg/dL (ref 8.4–10.5)
CHLORIDE: 104 meq/L (ref 96–112)
CO2: 26 meq/L (ref 19–32)
CREATININE: 1 mg/dL (ref 0.50–1.35)
GLUCOSE: 94 mg/dL (ref 70–99)
Potassium: 3.9 mEq/L (ref 3.5–5.3)
Sodium: 139 mEq/L (ref 135–145)
Total Protein: 6.9 g/dL (ref 6.0–8.3)

## 2014-03-22 LAB — POCT UA - MICROSCOPIC ONLY
CASTS, UR, LPF, POC: NEGATIVE
CRYSTALS, UR, HPF, POC: NEGATIVE
Epithelial cells, urine per micros: NEGATIVE
MUCUS UA: NEGATIVE
Yeast, UA: NEGATIVE

## 2014-03-22 MED ORDER — MONTELUKAST SODIUM 10 MG PO TABS: 10.0000 mg | ORAL_TABLET | Freq: Every day | ORAL | Status: DC

## 2014-03-22 MED ORDER — DOXYCYCLINE HYCLATE 100 MG PO CAPS: 100.0000 mg | ORAL_CAPSULE | Freq: Two times a day (BID) | ORAL | Status: DC

## 2014-03-22 MED ORDER — FLUTICASONE PROPIONATE 50 MCG/ACT NA SUSP: 2.0000 | Freq: Every day | NASAL | Status: DC

## 2014-03-22 MED ORDER — TRIAMCINOLONE ACETONIDE 0.1 % EX CREA: TOPICAL_CREAM | CUTANEOUS | Status: DC

## 2014-03-22 MED ORDER — HYDROCHLOROTHIAZIDE 12.5 MG PO CAPS: 12.5000 mg | ORAL_CAPSULE | ORAL | Status: DC

## 2014-03-22 NOTE — Progress Notes (Signed)
Patient ID: Danny GobbleRobert Garza MRN: [id number], DOB: 09/28/1971 43 y.o. Date of Encounter: 03/23/2014, 8:24 AM  Primary Physician: Carola FrostUNN,RYAN, PA-C  Chief Complaint: Physical (CPE)  HPI: 43 y.o. male with history noted below here for CPE. Doing well. Last physical was 02/04/12.  1) HTN: Well controlled. Currently taking HCTZ 12.5 mg daily. Tolerating without issues. No chest pain, chest tightness, headaches, vision changes, or focal deficits. Eats a healthy diet and tries to get in some exercise. Tries to get in some time for himself, but finds this hard at times. Has a trip out to the [place] this summer planned for vacation as well as a conference in [place]. Looking forward to both of these.   2) Cellulitis: 2-3 day history of mildly erythematous and pruritic lesion along the left lateral upper chest wall. Woke up on morning like this. Never saw anything bite him. No drainage. History of a tick bite along the left flank a couple of weeks ago. No headaches, photophobia, fever, chills, or diffuse rash.  3) Allergies: Well controlled on current regimen.     Review of Systems: Consitutional: No fever, chills, fatigue, night sweats, lymphadenopathy, or weight changes. Eyes: No visual changes, eye redness, or discharge. ENT/Mouth: Ears: No otalgia, tinnitus, hearing loss, discharge. Nose: No congestion, rhinorrhea, sinus pain, or epistaxis. Throat: No sore throat, post nasal drip, or teeth pain. Cardiovascular: No CP, palpitations, diaphoresis, DOE, edema, orthopnea, PND. Respiratory: No cough, hemoptysis, SOB, or wheezing. Gastrointestinal: No anorexia, dysphagia, reflux, pain, nausea, vomiting, hematemesis, diarrhea, constipation, BRBPR, or melena. Genitourinary: No dysuria, frequency, urgency, hematuria, incontinence, nocturia, decreased urinary stream, discharge, impotence, or testicular pain/masses. Musculoskeletal: No decreased ROM, myalgias, stiffness, joint swelling, or weakness. Skin: No rash,  erythema, lesion changes, pain, warmth, jaundice, or pruritis. Neurological: No headache, dizziness, syncope, seizures, tremors, memory loss, coordination problems, or paresthesias. Psychological: No anxiety, depression, hallucinations, SI/HI. Endocrine: No fatigue, polydipsia, polyphagia, polyuria, or known diabetes.   Past Medical History  Diagnosis Date  . HTN (hypertension)   . Allergic rhinitis   . Allergy      History reviewed. No pertinent past surgical history.  Home Meds:  Prior to Admission medications   Medication Sig Start Date End Date Taking? Authorizing Provider         cetirizine (ZYRTEC) 10 MG tablet Take 10 mg by mouth daily.   Yes Historical Provider, MD  fluticasone (FLONASE) 50 MCG/ACT nasal spray Place 2 sprays into the nose daily. 08/25/12  Yes Ryan M Dunn, PA-C  hydrochlorothiazide (MICROZIDE) 12.5 MG capsule Take 1 capsule (12.5 mg total) by mouth every morning. 04/13/13  Yes Ryan M Dunn, PA-C  montelukast (SINGULAIR) 10 MG tablet Take 1 tablet (10 mg total) by mouth at bedtime. 04/13/13  Yes Ryan M Dunn, PA-C    Allergies: No Known Allergies  History   Social History  . Marital Status: Unknown    Spouse Name: N/A    Number of Children: N/A  . Years of Education: N/A   Occupational History  . Not on file.   Social History Main Topics  . Smoking status: Never Smoker   . Smokeless tobacco: Never Used  . Alcohol Use: 1.0 oz/week    2 drink(s) per week  . Drug Use: No  . Sexual Activity: Not Currently   Other Topics Concern  . Not on file   Social History Narrative  . No narrative on file    Family History  Problem Relation Age of Onset  . Cancer Father  throat secondary to tobacco use  . Hypertension Maternal Grandmother   . Heart disease Mother     CABG in 46's    Physical Exam: Blood pressure 132/78, pulse 73, temperature 98.3 F (36.8 C), temperature source Oral, resp. rate 18, height 5\' 10"  (1.778 m), weight 183 lb (83.008 kg),  SpO2 97.00%.  General: Well developed, well nourished, in no acute distress. HEENT: Normocephalic, atraumatic. Conjunctiva pink, sclera non-icteric. Pupils 2 mm constricting to 1 mm, round, regular, and equally reactive to light and accomodation. EOMI. Internal auditory canal clear. TMs with good cone of light and without pathology. Nasal mucosa pink. Nares are without discharge. No sinus tenderness. Oral mucosa pink. Dentition normal and with braces. Pharynx without exudate.   Neck: Supple. Trachea midline. No thyromegaly. Full ROM. No lymphadenopathy. Lungs: Clear to auscultation bilaterally without wheezes, rales, or rhonchi. Breathing is of normal effort and unlabored. Cardiovascular: RRR with S1 S2. No murmurs, rubs, or gallops appreciated. Distal pulses 2+ symmetrically. No carotid or abdominal bruits. Abdomen: Soft, non-tender, non-distended with normoactive bowel sounds. No hepatosplenomegaly or masses. No rebound/guarding. No CVA tenderness. Without hernias.  Rectal: No external hemorrhoids or fissures. Rectal vault without masses. Prostate not enlarged, smooth, symmetrical, without nodules, or TTP.  Genitourinary: Circumcised male. No penile lesions. Testes descended bilaterally, and smooth without tenderness or masses.  Musculoskeletal: Full range of motion and 5/5 strength throughout. Without swelling, atrophy, tenderness, crepitus, or warmth. Extremities without clubbing, cyanosis, or edema. Calves supple. Skin: Warm and moist. Well healing area along the left flank with a scab without surrounding erythema, induration, or warmth. Left upper lateral chest wall with mild erythema. No induration, fluctuance, warmth, or TTP.  Neuro: A+Ox3. CN II-XII grossly intact. Moves all extremities spontaneously. Full sensation throughout. Normal gait. DTR 2+ throughout upper and lower extremities. Finger to nose intact. Psych:  Responds to questions appropriately with a normal affect.   Studies:     CBC, CMET, Lipid, PSA, TSH, and UA all pending. Patient is fasting.    Assessment/Plan:  43 y.o. male here for CPE with hypertension, history of tick bite along left flank, cellulitis along left anterior chest wall, and allergies  1) CPE -Healthy adult male physical  -Healthy diet and exercise -Weight loss -Age appropriate anticipatory guidance   2) Hypertension -Well controlled -HCTZ 12.5 mg daily #30 RF 11 -Healthy diet and exercise -Take time out for himself  3) History of tick bite -Doxycycline 100 mg 1 po bid #20 no RF  4) Cellulitis -Doxycycline 100 mg 1 po bid #20 no RF -Triamcinolone topical cream 0.1% Apply to affected area bid #45 grams no RF  5) Allergies  -Flonase 2 sprays each nare daily #1 RF 6 -Singulair 10 mg qhs #30 RF 11 -Zyrtec 10 daily     Signed, Eula Listen, PA-C [facility] [place], [place] [address] [phone] 03/23/2014 8:24 AM   ,

## 2014-03-23 LAB — TSH: TSH: 2.472 u[IU]/mL (ref 0.350–4.500)

## 2014-03-23 LAB — PSA: PSA: 1.18 ng/mL (ref <=4.00)

## 2014-09-10 ENCOUNTER — Ambulatory Visit (INDEPENDENT_AMBULATORY_CARE_PROVIDER_SITE_OTHER): Payer: BC Managed Care – PPO | Admitting: Physician Assistant

## 2014-09-10 VITALS — BP 126/72 | HR 72 | Temp 97.8°F | Resp 16 | Ht 71.0 in | Wt 186.0 lb

## 2014-09-10 DIAGNOSIS — L84 Corns and callosities: Secondary | ICD-10-CM

## 2014-09-10 NOTE — Progress Notes (Signed)
   Subjective:    Patient ID: Collene Gobbleobert Buenger, male    DOB: 02/11/1971, 43 y.o.   MRN: [id number]  HPI Patient presents for possible spider bite. First noticed small "dot" on L foot 5 days ago. Did not see a spider. Has gotten larger and felt hot and was red initially. Warm feeling and redness improved. Denies pain, itch, or fever. Denies being in woods or shed recently. No h/o eczema. Has gotten new shoes within past 2 weeks. Tried neosporin to make go away without improvement.    Review of Systems  Constitutional: Negative for fever and diaphoresis.  Respiratory: Negative for shortness of breath.   Cardiovascular: Negative for chest pain and leg swelling.  Gastrointestinal: Negative for nausea and abdominal pain.  Musculoskeletal: Negative for myalgias, joint swelling and arthralgias.  Skin: Positive for color change (red) and wound. Negative for pallor and rash.  Allergic/Immunologic: Negative for environmental allergies and food allergies.  Neurological: Negative for dizziness and headaches.  Hematological: Negative for adenopathy.       Objective:   Physical Exam  Constitutional: He is oriented to person, place, and time. He appears well-developed and well-nourished. No distress.  Blood pressure 126/72, pulse 72, temperature 97.8 F (36.6 C), temperature source Oral, resp. rate 16, height 5\' 11"  (1.803 m), weight 186 lb (84.369 kg), SpO2 98 %.   HENT:  Head: Normocephalic and atraumatic.  Right Ear: External ear normal.  Left Ear: External ear normal.  Eyes: Right eye exhibits no discharge. Left eye exhibits no discharge.  Musculoskeletal: Normal range of motion. He exhibits no edema or tenderness.  Neurological: He is alert and oriented to person, place, and time.  Skin: Skin is warm and dry. No rash noted. He is not diaphoretic. No erythema. No pallor.  Multiple small callouses located on the medial aspect of the L foot at the proximal metatarsal. 2 callouses have fluid  underneath. Brown and red in color. No erythema or warmth.        Assessment & Plan:  1. Pre-ulcerative corn or callous -Can use cortisone. - Can use mole skin to reduce friction.   Janan Ridgeishira Benzion Mesta PA-C  [facility] 09/10/2014 3:08 PM

## 2014-09-10 NOTE — Patient Instructions (Signed)
1. Can use mole skin to cover area.  2. May use cortisone.

## 2014-09-10 NOTE — Progress Notes (Signed)
I was directly involved with the patient's care and agree with the diagnosis and treatment plan.  

## 2015-03-30 ENCOUNTER — Other Ambulatory Visit: Payer: Self-pay

## 2015-03-30 DIAGNOSIS — I10 Essential (primary) hypertension: Secondary | ICD-10-CM

## 2015-03-30 MED ORDER — HYDROCHLOROTHIAZIDE 12.5 MG PO CAPS: 12.5000 mg | ORAL_CAPSULE | ORAL | Status: DC

## 2015-07-08 ENCOUNTER — Telehealth: Payer: Self-pay

## 2015-07-08 DIAGNOSIS — I10 Essential (primary) hypertension: Secondary | ICD-10-CM

## 2015-07-08 MED ORDER — HYDROCHLOROTHIAZIDE 12.5 MG PO CAPS: 12.5000 mg | ORAL_CAPSULE | ORAL | Status: DC

## 2015-07-08 NOTE — Telephone Encounter (Signed)
Danny  Garza student calling to remind you to refill his hydrochlorothiazide (MICROZIDE) 12.5 MG capsule   [phone] (M)

## 2015-07-08 NOTE — Telephone Encounter (Signed)
I called pt to see when he is going to  follow up? He may need an office visit. Mailbox is full and unable to leave message.

## 2015-07-08 NOTE — Telephone Encounter (Signed)
Pt made an appt with Mani on 9/29 at 3:15pm. I will send him in a month's worth.

## 2015-07-21 ENCOUNTER — Encounter: Payer: Self-pay | Admitting: Urgent Care

## 2015-07-21 ENCOUNTER — Ambulatory Visit (INDEPENDENT_AMBULATORY_CARE_PROVIDER_SITE_OTHER): Payer: BLUE CROSS/BLUE SHIELD | Admitting: Urgent Care

## 2015-07-21 VITALS — BP 123/80 | HR 83 | Temp 98.1°F | Resp 16 | Ht 70.5 in | Wt 185.6 lb

## 2015-07-21 DIAGNOSIS — J302 Other seasonal allergic rhinitis: Secondary | ICD-10-CM | POA: Diagnosis not present

## 2015-07-21 DIAGNOSIS — I10 Essential (primary) hypertension: Secondary | ICD-10-CM

## 2015-07-21 MED ORDER — HYDROCHLOROTHIAZIDE 12.5 MG PO CAPS: 12.5000 mg | ORAL_CAPSULE | ORAL | Status: DC

## 2015-07-21 MED ORDER — MOMETASONE FUROATE 50 MCG/ACT NA SUSP: 2.0000 | Freq: Every day | NASAL | Status: DC

## 2015-07-21 MED ORDER — MONTELUKAST SODIUM 10 MG PO TABS: 10.0000 mg | ORAL_TABLET | Freq: Every day | ORAL | Status: DC

## 2015-07-21 NOTE — Progress Notes (Signed)
    MRN: [id number] DOB: [birth year]  Subjective:   Danny Garza is a 44 y.o. male presenting for chief complaint of Establish Care and Medication Refill  HTN - diagnosed in 2013. Managed hydrochlorothiazide 12.5mg  daily. Diet is mostly healthy, exercises regularly. Denies lightheadedness, dizziness, chronic headache, double vision, chest pain, shortness of breath, heart racing, palpitations, nausea, vomiting, abdominal pain, hematuria, lower leg swelling. Of note, patient states that his high blood pressures is mostly associated with stress which he is managing well with the counselor. Denies smoking cigarettes.  Seasonal allergies - needs refill on montelukast. Usually uses Zyrtec during the day but has needed montelukast more this allergy season. He does have a consistent tickle in his throat. Denies fever, sinus pain, sinus congestion, cough, sore throat, ear pain, ear drainage.  Denies any other aggravating or relieving factors, no other questions or concerns.  Ottie has a current medication list which includes the following prescription(s): cetirizine, hydrochlorothiazide, and montelukast. Also has No Known Allergies.  Danny Garza  has a past medical history of HTN (hypertension); Allergic rhinitis; and Allergy. Also  has no past surgical history on file.  Objective:   Vitals: BP 123/80 mmHg  Pulse 83  Temp(Src) 98.1 F (36.7 C) (Oral)  Resp 16  Ht 5' 10.5" (1.791 m)  Wt 185 lb 9.6 oz (84.188 kg)  BMI 26.25 kg/m2  BP Readings from Last 3 Encounters:  07/21/15 123/80  09/10/14 126/72  03/22/14 132/78   Physical Exam  Constitutional: He is oriented to person, place, and time. He appears well-developed and well-nourished.  HENT:  TM's intact bilaterally, no effusions or erythema. Nasal turbinates slightly boggy and edematous. Postnasal drip present, without oropharyngeal exudates, erythema or abscesses.  Eyes: Conjunctivae are normal. Pupils are equal, round, and reactive to  light. No scleral icterus.  Neck: Normal range of motion. Neck supple. No thyromegaly present.  Cardiovascular: Normal rate, regular rhythm and intact distal pulses.  Exam reveals no gallop and no friction rub.   No murmur heard. Pulmonary/Chest: No respiratory distress. He has no wheezes. He has no rales.  Musculoskeletal: He exhibits no edema.  Neurological: He is alert and oriented to person, place, and time.  Skin: Skin is warm and dry. No rash noted. No erythema. No pallor.   Assessment and Plan :   1. Essential hypertension - Well controlled, continue regimen of 12.5 mg of hydrochlorothiazide. Refills provided for one year patient is to followup in 4 weeks for complete physical.  2. Seasonal allergies - Restart nasal steroid, refill Singulair. Patient to follow up in one year or should her necessary.  Danny Bamberg, PA-C [facility] [phone] 07/21/2015 4:09 PM

## 2015-07-21 NOTE — Patient Instructions (Addendum)
Hypertension Hypertension, commonly called high blood pressure, is when the force of blood pumping through your arteries is too strong. Your arteries are the blood vessels that carry blood from your heart throughout your body. A blood pressure reading consists of a higher number over a lower number, such as 110/72. The higher number (systolic) is the pressure inside your arteries when your heart pumps. The lower number (diastolic) is the pressure inside your arteries when your heart relaxes. Ideally you want your blood pressure below 120/80. Hypertension forces your heart to work harder to pump blood. Your arteries may become narrow or stiff. Having hypertension puts you at risk for heart disease, stroke, and other problems.  RISK FACTORS Some risk factors for high blood pressure are controllable. Others are not.  Risk factors you cannot control include:   Race. You may be at higher risk if you are African American.  Age. Risk increases with age.  Gender. Men are at higher risk than women before age 45 years. After age 65, women are at higher risk than men. Risk factors you can control include:  Not getting enough exercise or physical activity.  Being overweight.  Getting too much fat, sugar, calories, or salt in your diet.  Drinking too much alcohol. SIGNS AND SYMPTOMS Hypertension does not usually cause signs or symptoms. Extremely high blood pressure (hypertensive crisis) may cause headache, anxiety, shortness of breath, and nosebleed. DIAGNOSIS  To check if you have hypertension, your health care provider will measure your blood pressure while you are seated, with your arm held at the level of your heart. It should be measured at least twice using the same arm. Certain conditions can cause a difference in blood pressure between your right and left arms. A blood pressure reading that is higher than normal on one occasion does not mean that you need treatment. If one blood pressure reading  is high, ask your health care provider about having it checked again. TREATMENT  Treating high blood pressure includes making lifestyle changes and possibly taking medicine. Living a healthy lifestyle can help lower high blood pressure. You may need to change some of your habits. Lifestyle changes may include:  Following the DASH diet. This diet is high in fruits, vegetables, and whole grains. It is low in salt, red meat, and added sugars.  Getting at least 2 hours of brisk physical activity every week.  Losing weight if necessary.  Not smoking.  Limiting alcoholic beverages.  Learning ways to reduce stress. If lifestyle changes are not enough to get your blood pressure under control, your health care provider may prescribe medicine. You may need to take more than one. Work closely with your health care provider to understand the risks and benefits. HOME CARE INSTRUCTIONS  Have your blood pressure rechecked as directed by your health care provider.   Take medicines only as directed by your health care provider. Follow the directions carefully. Blood pressure medicines must be taken as prescribed. The medicine does not work as well when you skip doses. Skipping doses also puts you at risk for problems.   Do not smoke.   Monitor your blood pressure at home as directed by your health care provider. SEEK MEDICAL CARE IF:   You think you are having a reaction to medicines taken.  You have recurrent headaches or feel dizzy.  You have swelling in your ankles.  You have trouble with your vision. SEEK IMMEDIATE MEDICAL CARE IF:  You develop a severe headache or confusion.    You have unusual weakness, numbness, or feel faint.  You have severe chest or abdominal pain.  You vomit repeatedly.  You have trouble breathing. MAKE SURE YOU:   Understand these instructions.  Will watch your condition.  Will get help right away if you are not doing well or get worse. Document  Released: 10/08/2005 Document Revised: 02/22/2014 Document Reviewed: 07/31/2013 [facility] Patient Information 2015 [place], [place]. This information is not intended to replace advice given to you by your health care provider. Make sure you discuss any questions you have with your health care provider.   Allergies Allergies may happen from anything your body is sensitive to. This may be food, medicines, pollens, chemicals, and nearly anything around you in everyday life that produces allergens. An allergen is anything that causes an allergy producing substance. Heredity is often a factor in causing these problems. This means you may have some of the same allergies as your parents. Food allergies happen in all age groups. Food allergies are some of the most severe and life threatening. Some common food allergies are cow's milk, seafood, eggs, nuts, wheat, and soybeans. SYMPTOMS   Swelling around the mouth.  An itchy red rash or hives.  Vomiting or diarrhea.  Difficulty breathing. SEVERE ALLERGIC REACTIONS ARE LIFE-THREATENING. This reaction is called anaphylaxis. It can cause the mouth and throat to swell and cause difficulty with breathing and swallowing. In severe reactions only a trace amount of food (for example, peanut oil in a salad) may cause death within seconds. Seasonal allergies occur in all age groups. These are seasonal because they usually occur during the same season every year. They may be a reaction to molds, grass pollens, or tree pollens. Other causes of problems are house dust mite allergens, pet dander, and mold spores. The symptoms often consist of nasal congestion, a runny itchy nose associated with sneezing, and tearing itchy eyes. There is often an associated itching of the mouth and ears. The problems happen when you come in contact with pollens and other allergens. Allergens are the particles in the air that the body reacts to with an allergic reaction. This causes you to  release allergic antibodies. Through a chain of events, these eventually cause you to release histamine into the blood stream. Although it is meant to be protective to the body, it is this release that causes your discomfort. This is why you were given anti-histamines to feel better. If you are unable to pinpoint the offending allergen, it may be determined by skin or blood testing. Allergies cannot be cured but can be controlled with medicine. Hay fever is a collection of all or some of the seasonal allergy problems. It may often be treated with simple over-the-counter medicine such as diphenhydramine. Take medicine as directed. Do not drink alcohol or drive while taking this medicine. Check with your caregiver or package insert for child dosages. If these medicines are not effective, there are many new medicines your caregiver can prescribe. Stronger medicine such as nasal spray, eye drops, and corticosteroids may be used if the first things you try do not work well. Other treatments such as immunotherapy or desensitizing injections can be used if all else fails. Follow up with your caregiver if problems continue. These seasonal allergies are usually not life threatening. They are generally more of a nuisance that can often be handled using medicine. HOME CARE INSTRUCTIONS   If unsure what causes a reaction, keep a diary of foods eaten and symptoms that follow. Avoid foods  that cause reactions.  If hives or rash are present:  Take medicine as directed.  You may use an over-the-counter antihistamine (diphenhydramine) for hives and itching as needed.  Apply cold compresses (cloths) to the skin or take baths in cool water. Avoid hot baths or showers. Heat will make a rash and itching worse.  If you are severely allergic:  Following a treatment for a severe reaction, hospitalization is often required for closer follow-up.  Wear a medic-alert bracelet or necklace stating the allergy.  You and your  family must learn how to give adrenaline or use an anaphylaxis kit.  If you have had a severe reaction, always carry your anaphylaxis kit or EpiPen with you. Use this medicine as directed by your caregiver if a severe reaction is occurring. Failure to do so could have a fatal outcome. SEEK MEDICAL CARE IF:  You suspect a food allergy. Symptoms generally happen within 30 minutes of eating a food.  Your symptoms have not gone away within 2 days or are getting worse.  You develop new symptoms.  You want to retest yourself or your child with a food or drink you think causes an allergic reaction. Never do this if an anaphylactic reaction to that food or drink has happened before. Only do this under the care of a caregiver. SEEK IMMEDIATE MEDICAL CARE IF:   You have difficulty breathing, are wheezing, or have a tight feeling in your chest or throat.  You have a swollen mouth, or you have hives, swelling, or itching all over your body.  You have had a severe reaction that has responded to your anaphylaxis kit or an EpiPen. These reactions may return when the medicine has worn off. These reactions should be considered life threatening. MAKE SURE YOU:   Understand these instructions.  Will watch your condition.  Will get help right away if you are not doing well or get worse. Document Released: 01/01/2003 Document Revised: 02/02/2013 Document Reviewed: 06/07/2008 [facility] Patient Information 2015 [place], [place]. This information is not intended to replace advice given to you by your health care provider. Make sure you discuss any questions you have with your health care provider.

## 2015-08-25 ENCOUNTER — Ambulatory Visit: Payer: BLUE CROSS/BLUE SHIELD | Admitting: Urgent Care

## 2015-09-01 ENCOUNTER — Encounter: Payer: Self-pay | Admitting: Urgent Care

## 2015-09-01 ENCOUNTER — Ambulatory Visit (INDEPENDENT_AMBULATORY_CARE_PROVIDER_SITE_OTHER): Payer: BLUE CROSS/BLUE SHIELD | Admitting: Urgent Care

## 2015-09-01 VITALS — BP 126/85 | HR 78 | Temp 98.4°F | Resp 16 | Ht 70.0 in | Wt 181.2 lb

## 2015-09-01 DIAGNOSIS — I10 Essential (primary) hypertension: Secondary | ICD-10-CM

## 2015-09-01 DIAGNOSIS — Z23 Encounter for immunization: Secondary | ICD-10-CM | POA: Diagnosis not present

## 2015-09-01 DIAGNOSIS — Z0000 Encounter for general adult medical examination without abnormal findings: Secondary | ICD-10-CM | POA: Diagnosis not present

## 2015-09-01 DIAGNOSIS — J302 Other seasonal allergic rhinitis: Secondary | ICD-10-CM

## 2015-09-01 LAB — CBC
HEMATOCRIT: 40.2 % (ref 39.0–52.0)
HEMOGLOBIN: 13.2 g/dL (ref 13.0–17.0)
MCH: 24.5 pg — AB (ref 26.0–34.0)
MCHC: 32.8 g/dL (ref 30.0–36.0)
MCV: 74.6 fL — ABNORMAL LOW (ref 78.0–100.0)
MPV: 9.3 fL (ref 8.6–12.4)
Platelets: 346 10*3/uL (ref 150–400)
RBC: 5.39 MIL/uL (ref 4.22–5.81)
RDW: 16.8 % — AB (ref 11.5–15.5)
WBC: 7 10*3/uL (ref 4.0–10.5)

## 2015-09-01 LAB — COMPREHENSIVE METABOLIC PANEL
ALBUMIN: 4.6 g/dL (ref 3.6–5.1)
ALT: 15 U/L (ref 9–46)
AST: 18 U/L (ref 10–40)
Alkaline Phosphatase: 97 U/L (ref 40–115)
BUN: 15 mg/dL (ref 7–25)
CALCIUM: 9.6 mg/dL (ref 8.6–10.3)
CHLORIDE: 101 mmol/L (ref 98–110)
CO2: 29 mmol/L (ref 20–31)
Creat: 1.03 mg/dL (ref 0.60–1.35)
GLUCOSE: 92 mg/dL (ref 65–99)
POTASSIUM: 4.3 mmol/L (ref 3.5–5.3)
Sodium: 138 mmol/L (ref 135–146)
Total Bilirubin: 0.5 mg/dL (ref 0.2–1.2)
Total Protein: 7.2 g/dL (ref 6.1–8.1)

## 2015-09-01 LAB — POCT URINALYSIS DIP (MANUAL ENTRY)
BILIRUBIN UA: NEGATIVE
BILIRUBIN UA: NEGATIVE
GLUCOSE UA: NEGATIVE
Leukocytes, UA: NEGATIVE
NITRITE UA: NEGATIVE
Protein Ur, POC: NEGATIVE
RBC UA: NEGATIVE
Spec Grav, UA: 1.015
Urobilinogen, UA: 0.2
pH, UA: 7

## 2015-09-01 LAB — LIPID PANEL
CHOL/HDL RATIO: 3.9 ratio (ref <=5.0)
CHOLESTEROL: 169 mg/dL (ref 125–200)
HDL: 43 mg/dL (ref >=40)
LDL Cholesterol: 106 mg/dL (ref <130)
TRIGLYCERIDES: 100 mg/dL (ref <150)
VLDL: 20 mg/dL (ref <30)

## 2015-09-01 LAB — TSH: TSH: 1.959 u[IU]/mL (ref 0.350–4.500)

## 2015-09-01 NOTE — Patient Instructions (Addendum)
Keeping you healthy  Get these tests  Blood pressure- Have your blood pressure checked once a year by your healthcare provider.  Normal blood pressure is 120/80.  Weight- Have your body mass index (BMI) calculated to screen for obesity.  BMI is a measure of body fat based on height and weight. You can also calculate your own BMI at [email or web address]/.  Cholesterol- Have your cholesterol checked regularly starting at age 43, sooner may be necessary if you have diabetes, high blood pressure, if a family member developed heart diseases at an early age or if you smoke.   Chlamydia, HIV, and other sexual transmitted disease- Get screened each year until the age of 25 then within three months of each new sexual partner.  Diabetes- Have your blood sugar checked regularly if you have high blood pressure, high cholesterol, a family history of diabetes or if you are overweight.  Get these vaccines  Flu shot- Every fall.  Tetanus shot- Every 10 years.  Menactra- Single dose; prevents meningitis.  Take these steps  Don't smoke- If you do smoke, ask your healthcare provider about quitting. For tips on how to quit, go to www.smokefree.gov or call 1-800-QUIT-NOW.  Be physically active- Exercise 5 days a week for at least 30 minutes.  If you are not already physically active start slow and gradually work up to 30 minutes of moderate physical activity.  Examples of moderate activity include walking briskly, mowing the yard, dancing, swimming bicycling, etc.  Eat a healthy diet- Eat a variety of healthy foods such as fruits, vegetables, low fat milk, low fat cheese, yogurt, lean meats, poultry, fish, beans, tofu, etc.  For more information on healthy eating, go to www.thenutritionsource.org  Drink alcohol in moderation- Limit alcohol intake two drinks or less a day.  Never drink and drive.  Dentist- Brush and floss teeth twice daily; visit your dentis twice a year.  Depression-Your emotional  health is as important as your physical health.  If you're feeling down, losing interest in things you normally enjoy please talk with your healthcare provider.  Gun Safety- If you keep a gun in your home, keep it unloaded and with the safety lock on.  Bullets should be stored separately.  Helmet use- Always wear a helmet when riding a motorcycle, bicycle, rollerblading or skateboarding.  Safe sex- If you may be exposed to a sexually transmitted infection, use a condom  Seat belts- Seat bels can save your life; always wear one.  Smoke/Carbon Monoxide detectors- These detectors need to be installed on the appropriate level of your home.  Replace batteries at least once a year.  Skin Cancer- When out in the sun, cover up and use sunscreen SPF 15 or higher.  Violence- If anyone is threatening or hurting you, please tell your healthcare provider.    Tdap Vaccine (Tetanus, Diphtheria and Pertussis): What You Need to Know 1. Why get vaccinated? Tetanus, diphtheria and pertussis are very serious diseases. Tdap vaccine can protect us from these diseases. And, Tdap vaccine given to pregnant women can protect newborn babies against pertussis. TETANUS (Lockjaw) is rare in the [place] today. It causes painful muscle tightening and stiffness, usually all over the body.  It can lead to tightening of muscles in the head and neck so you can't open your mouth, swallow, or sometimes even breathe. Tetanus kills about 1 out of 10 people who are infected even after receiving the best medical care. DIPHTHERIA is also rare in the [place] today. It   can cause a thick coating to form in the back of the throat.  It can lead to breathing problems, heart failure, paralysis, and death. PERTUSSIS (Whooping Cough) causes severe coughing spells, which can cause difficulty breathing, vomiting and disturbed sleep.  It can also lead to weight loss, incontinence, and rib fractures. Up to 2 in 100 adolescents and  5 in 100 adults with pertussis are hospitalized or have complications, which could include pneumonia or death. These diseases are caused by bacteria. Diphtheria and pertussis are spread from person to person through secretions from coughing or sneezing. Tetanus enters the body through cuts, scratches, or wounds. Before vaccines, as many as 200,000 cases of diphtheria, 200,000 cases of pertussis, and hundreds of cases of tetanus, were reported in the [place] each year. Since vaccination began, reports of cases for tetanus and diphtheria have dropped by about 99% and for pertussis by about 80%. 2. Tdap vaccine Tdap vaccine can protect adolescents and adults from tetanus, diphtheria, and pertussis. One dose of Tdap is routinely given at age 11 or 12. People who did not get Tdap at that age should get it as soon as possible. Tdap is especially important for healthcare professionals and anyone having close contact with a baby younger than 12 months. Pregnant women should get a dose of Tdap during every pregnancy, to protect the newborn from pertussis. Infants are most at risk for severe, life-threatening complications from pertussis. Another vaccine, called Td, protects against tetanus and diphtheria, but not pertussis. A Td booster should be given every 10 years. Tdap may be given as one of these boosters if you have never gotten Tdap before. Tdap may also be given after a severe cut or burn to prevent tetanus infection. Your doctor or the person giving you the vaccine can give you more information. Tdap may safely be given at the same time as other vaccines. 3. Some people should not get this vaccine  A person who has ever had a life-threatening allergic reaction after a previous dose of any diphtheria, tetanus or pertussis containing vaccine, OR has a severe allergy to any part of this vaccine, should not get Tdap vaccine. Tell the person giving the vaccine about any severe allergies.  Anyone who  had coma or long repeated seizures within 7 days after a childhood dose of DTP or DTaP, or a previous dose of Tdap, should not get Tdap, unless a cause other than the vaccine was found. They can still get Td.  Talk to your doctor if you:  have seizures or another nervous system problem,  had severe pain or swelling after any vaccine containing diphtheria, tetanus or pertussis,  ever had a condition called Guillain-Barr Syndrome (GBS),  aren't feeling well on the day the shot is scheduled. 4. Risks With any medicine, including vaccines, there is a chance of side effects. These are usually mild and go away on their own. Serious reactions are also possible but are rare. Most people who get Tdap vaccine do not have any problems with it. Mild problems following Tdap (Did not interfere with activities)  Pain where the shot was given (about 3 in 4 adolescents or 2 in 3 adults)  Redness or swelling where the shot was given (about 1 person in 5)  Mild fever of at least 100.4F (up to about 1 in 25 adolescents or 1 in 100 adults)  Headache (about 3 or 4 people in 10)  Tiredness (about 1 person in 3 or 4)  Nausea, vomiting,   diarrhea, stomach ache (up to 1 in 4 adolescents or 1 in 10 adults)  Chills, sore joints (about 1 person in 10)  Body aches (about 1 person in 3 or 4)  Rash, swollen glands (uncommon) Moderate problems following Tdap (Interfered with activities, but did not require medical attention)  Pain where the shot was given (up to 1 in 5 or 6)  Redness or swelling where the shot was given (up to about 1 in 16 adolescents or 1 in 12 adults)  Fever over 102F (about 1 in 100 adolescents or 1 in 250 adults)  Headache (about 1 in 7 adolescents or 1 in 10 adults)  Nausea, vomiting, diarrhea, stomach ache (up to 1 or 3 people in 100)  Swelling of the entire arm where the shot was given (up to about 1 in 500). Severe problems following Tdap (Unable to perform usual  activities; required medical attention)  Swelling, severe pain, bleeding and redness in the arm where the shot was given (rare). Problems that could happen after any vaccine:  People sometimes faint after a medical procedure, including vaccination. Sitting or lying down for about 15 minutes can help prevent fainting, and injuries caused by a fall. Tell your doctor if you feel dizzy, or have vision changes or ringing in the ears.  Some people get severe pain in the shoulder and have difficulty moving the arm where a shot was given. This happens very rarely.  Any medication can cause a severe allergic reaction. Such reactions from a vaccine are very rare, estimated at fewer than 1 in a million doses, and would happen within a few minutes to a few hours after the vaccination. As with any medicine, there is a very remote chance of a vaccine causing a serious injury or death. The safety of vaccines is always being monitored. For more information, visit: www.cdc.gov/vaccinesafety/ 5. What if there is a serious problem? What should I look for?  Look for anything that concerns you, such as signs of a severe allergic reaction, very high fever, or unusual behavior.  Signs of a severe allergic reaction can include hives, swelling of the face and throat, difficulty breathing, a fast heartbeat, dizziness, and weakness. These would usually start a few minutes to a few hours after the vaccination. What should I do?  If you think it is a severe allergic reaction or other emergency that can't wait, call 9-1-1 or get the person to the nearest hospital. Otherwise, call your doctor.  Afterward, the reaction should be reported to the Vaccine Adverse Event Reporting System (VAERS). Your doctor might file this report, or you can do it yourself through the VAERS web site at www.vaers.hhs.gov, or by calling 1-800-822-7967. VAERS does not give medical advice.  6. The National Vaccine Injury Compensation Program The  National Vaccine Injury Compensation Program (VICP) is a federal program that was created to compensate people who may have been injured by certain vaccines. Persons who believe they may have been injured by a vaccine can learn about the program and about filing a claim by calling 1-800-338-2382 or visiting the VICP website at www.hrsa.gov/vaccinecompensation. There is a time limit to file a claim for compensation. 7. How can I learn more?  Ask your doctor. He or she can give you the vaccine package insert or suggest other sources of information.  Call your local or state health department.  Contact the Centers for Disease Control and Prevention (CDC):  Call 1-800-232-4636 (1-800-CDC-INFO) or  Visit CDC's website at www.cdc.gov/vaccines   CDC Tdap Vaccine VIS (12/15/13)   This information is not intended to replace advice given to you by your health care provider. Make sure you discuss any questions you have with your health care provider.   Document Released: 04/08/2012 Document Revised: 10/29/2014 Document Reviewed: 01/20/2014 [employer] Interactive Patient Education 2016 [employer]  

## 2015-09-01 NOTE — Progress Notes (Signed)
MRN: [id number]  Subjective:   Danny Garza is a 44 y.o. male presenting for annual physical exam.  Medical care team includes: PCP: None.   Danny Garza does not have any active problems on his problem list.  Patient is a professor of religion at [employer] He is single, tries to eat healthily, exercises 2-3 times a day.   Danny Garza has a current medication list which includes the following prescription(s): cetirizine, hydrochlorothiazide, mometasone, and montelukast. He has No Known Allergies.  Danny Garza  has a past medical history of HTN (hypertension); Allergic rhinitis; and Allergy. Also  has no past surgical history on file.  family history includes Cancer in his father; Heart disease in his mother; Hypertension in his maternal grandmother.  Immunizations: Flu shot 07/2015, TDAP needs to be updated.  Review of Systems  Constitutional: Negative for fever, chills, weight loss, malaise/fatigue and diaphoresis.  HENT: Negative for congestion, ear discharge, ear pain, hearing loss, nosebleeds, sore throat and tinnitus.   Eyes: Negative for blurred vision, double vision, photophobia, pain, discharge and redness.  Respiratory: Negative for cough, shortness of breath and wheezing.   Cardiovascular: Negative for chest pain, palpitations and leg swelling.  Gastrointestinal: Negative for nausea, vomiting, abdominal pain, diarrhea, constipation and blood in stool.  Genitourinary: Negative for dysuria, urgency, frequency, hematuria and flank pain.  Musculoskeletal: Negative for myalgias, back pain and joint pain.  Skin: Negative for itching and rash.  Neurological: Negative for dizziness, tingling, seizures, loss of consciousness, weakness and headaches.  Endo/Heme/Allergies: Negative for polydipsia.  Psychiatric/Behavioral: Negative for depression, suicidal ideas, hallucinations, memory loss and substance abuse. The patient is not nervous/anxious and does not have insomnia.     Objective:   Vitals: BP 126/85 mmHg  Pulse 78  Temp(Src) 98.4 F (36.9 C) (Oral)  Resp 16  Ht  (1.778 m)  Wt 181 lb 3.2 oz (82.192 kg)  BMI 26.00 kg/m2  BP Readings from Last 3 Encounters:  09/01/15 126/85  07/21/15 123/80  09/10/14 126/72   Physical Exam  Constitutional: He is oriented to person, place, and time. He appears well-developed and well-nourished.  HENT:  TM's intact bilaterally, no effusions or erythema. Nares patent, nasal turbinates pink and moist, nasal passages patent. No sinus tenderness. Oropharynx clear, mucous membranes moist, dentition in good repair.  Eyes: Conjunctivae and EOM are normal. Pupils are equal, round, and reactive to light. Right eye exhibits no discharge. Left eye exhibits no discharge. No scleral icterus.  Neck: Normal range of motion. Neck supple. No thyromegaly present.  Cardiovascular: Normal rate, regular rhythm and intact distal pulses.  Exam reveals no gallop and no friction rub.   No murmur heard. Pulmonary/Chest: No stridor. No respiratory distress. He has no wheezes. He has no rales.  Abdominal: Soft. Bowel sounds are normal. He exhibits no distension and no mass. There is no tenderness.  Musculoskeletal: Normal range of motion. He exhibits no edema or tenderness.  Lymphadenopathy:    He has no cervical adenopathy.  Neurological: He is alert and oriented to person, place, and time.  Skin: Skin is warm and dry. No rash noted. No erythema. No pallor.  Psychiatric: He has a normal mood and affect.   Results for orders placed or performed in visit on 09/01/15 (from the past 24 hour(s))  CBC     Status: Abnormal   Collection Time: 09/01/15  4:30 PM  Result Value Ref Range   WBC 7.0 4.0 - 10.5 K/uL   RBC 5.39 4.22 -  5.81 MIL/uL   Hemoglobin 13.2 13.0 - 17.0 g/dL   HCT 96.040.2 45.439.0 - 09.852.0 %   MCV 74.6 (L) 78.0 - 100.0 fL   MCH 24.5 (L) 26.0 - 34.0 pg   MCHC 32.8 30.0 - 36.0 g/dL   RDW 11.916.8 (H) 14.711.5 - 82.915.5 %   Platelets 346  150 - 400 K/uL   MPV 9.3 8.6 - 12.4 fL   Narrative   Performed at:  [facility]                [address], [address]                [place], [place] [address]  Comprehensive metabolic panel     Status: None   Collection Time: 09/01/15  4:30 PM  Result Value Ref Range   Sodium 138 135 - 146 mmol/L   Potassium 4.3 3.5 - 5.3 mmol/L   Chloride 101 98 - 110 mmol/L   CO2 29 20 - 31 mmol/L   Glucose, Bld 92 65 - 99 mg/dL   BUN 15 7 - 25 mg/dL   Creat 5.781.03 4.690.60 - 6.291.35 mg/dL   Total Bilirubin 0.5 0.2 - 1.2 mg/dL   Alkaline Phosphatase 97 40 - 115 U/L   AST 18 10 - 40 U/L   ALT 15 9 - 46 U/L   Total Protein 7.2 6.1 - 8.1 g/dL   Albumin 4.6 3.6 - 5.1 g/dL   Calcium 9.6 8.6 - 52.810.3 mg/dL   Narrative   Performed at:  [facility]                [address], [address]                [place], [place] [address]  TSH     Status: None   Collection Time: 09/01/15  4:30 PM  Result Value Ref Range   TSH 1.959 0.350 - 4.500 uIU/mL   Narrative   Performed at:  [facility]                [address], [address]                [place], [place] [address]  Lipid panel     Status: None   Collection Time: 09/01/15  4:30 PM  Result Value Ref Range   Cholesterol 169 125 - 200 mg/dL   Triglycerides 440100 <347<150 mg/dL   HDL 43 >=42>=40 mg/dL   Total CHOL/HDL Ratio 3.9 <=5.0 Ratio   VLDL 20 <30 mg/dL   LDL Cholesterol 595106 <638<130 mg/dL   Narrative   Performed at:  [facility]                [address], [address]                [place], [place] [address]  POCT urinalysis dipstick     Status: Abnormal   Collection Time: 09/01/15  7:24 PM  Result Value Ref Range   Color, UA light yellow (A) yellow   Clarity, UA clear clear   Glucose, UA negative negative   Bilirubin, UA negative negative   Ketones, POC UA negative negative   Spec Grav, UA 1.015    Blood, UA negative negative   pH, UA 7.0    Protein Ur, POC negative negative   Urobilinogen, UA 0.2    Nitrite, UA  Negative Negative   Leukocytes, UA Negative Negative  Assessment and Plan :   1. Annual physical exam - Discussed healthy lifestyle, diet, exercise, preventative care, vaccinations, and addressed patient's concerns.  - Labs pending, call patient with results, VM okay.  2. Essential hypertension - Well-controlled, continue current regimen, follow up in 6 mos - 1 year. - POCT urinalysis dipstick  3. Seasonal allergies - Stable, continue allergy treatment as needed  4. Need for Tdap vaccination - Tdap vaccine greater than or equal to 7yo IM   Wallis Bamberg, PA-C [facility] [phone] 09/01/2015  3:58 PM

## 2015-09-02 ENCOUNTER — Encounter: Payer: Self-pay | Admitting: Urgent Care

## 2016-09-04 ENCOUNTER — Other Ambulatory Visit: Payer: Self-pay | Admitting: Urgent Care

## 2016-09-04 DIAGNOSIS — I10 Essential (primary) hypertension: Secondary | ICD-10-CM

## 2016-09-06 ENCOUNTER — Encounter: Payer: BLUE CROSS/BLUE SHIELD | Admitting: Urgent Care

## 2018-01-09 ENCOUNTER — Encounter: Payer: Self-pay | Admitting: Urgent Care

## 2018-01-09 ENCOUNTER — Ambulatory Visit: Payer: BLUE CROSS/BLUE SHIELD | Admitting: Urgent Care

## 2018-01-09 DIAGNOSIS — I10 Essential (primary) hypertension: Secondary | ICD-10-CM

## 2018-01-09 MED ORDER — HYDROCHLOROTHIAZIDE 12.5 MG PO CAPS: 12.5000 mg | ORAL_CAPSULE | ORAL | 3 refills | Status: DC

## 2018-01-09 MED ORDER — LISINOPRIL 10 MG PO TABS: 10.0000 mg | ORAL_TABLET | Freq: Every day | ORAL | 3 refills | Status: DC

## 2018-01-09 NOTE — Patient Instructions (Addendum)
Hypertension Hypertension, commonly called high blood pressure, is when the force of blood pumping through the arteries is too strong. The arteries are the blood vessels that carry blood from the heart throughout the body. Hypertension forces the heart to work harder to pump blood and may cause arteries to become narrow or stiff. Having untreated or uncontrolled hypertension can cause heart attacks, strokes, kidney disease, and other problems. A blood pressure reading consists of a higher number over a lower number. Ideally, your blood pressure should be below 120/80. The first ("top") number is called the systolic pressure. It is a measure of the pressure in your arteries as your heart beats. The second ("bottom") number is called the diastolic pressure. It is a measure of the pressure in your arteries as the heart relaxes. What are the causes? The cause of this condition is not known. What increases the risk? Some risk factors for high blood pressure are under your control. Others are not. Factors you can change  Smoking.  Having type 2 diabetes mellitus, high cholesterol, or both.  Not getting enough exercise or physical activity.  Being overweight.  Having too much fat, sugar, calories, or salt (sodium) in your diet.  Drinking too much alcohol. Factors that are difficult or impossible to change  Having chronic kidney disease.  Having a family history of high blood pressure.  Age. Risk increases with age.  Race. You may be at higher risk if you are African-American.  Gender. Men are at higher risk than women before age 45. After age 65, women are at higher risk than men.  Having obstructive sleep apnea.  Stress. What are the signs or symptoms? Extremely high blood pressure (hypertensive crisis) may cause:  Headache.  Anxiety.  Shortness of breath.  Nosebleed.  Nausea and vomiting.  Severe chest pain.  Jerky movements you cannot control (seizures).  How is this  diagnosed? This condition is diagnosed by measuring your blood pressure while you are seated, with your arm resting on a surface. The cuff of the blood pressure monitor will be placed directly against the skin of your upper arm at the level of your heart. It should be measured at least twice using the same arm. Certain conditions can cause a difference in blood pressure between your right and left arms. Certain factors can cause blood pressure readings to be lower or higher than normal (elevated) for a short period of time:  When your blood pressure is higher when you are in a health care provider's office than when you are at home, this is called white coat hypertension. Most people with this condition do not need medicines.  When your blood pressure is higher at home than when you are in a health care provider's office, this is called masked hypertension. Most people with this condition may need medicines to control blood pressure.  If you have a high blood pressure reading during one visit or you have normal blood pressure with other risk factors:  You may be asked to return on a different day to have your blood pressure checked again.  You may be asked to monitor your blood pressure at home for 1 week or longer.  If you are diagnosed with hypertension, you may have other blood or imaging tests to help your health care provider understand your overall risk for other conditions. How is this treated? This condition is treated by making healthy lifestyle changes, such as eating healthy foods, exercising more, and reducing your alcohol intake. Your   health care provider may prescribe medicine if lifestyle changes are not enough to get your blood pressure under control, and if:  Your systolic blood pressure is above 130.  Your diastolic blood pressure is above 80.  Your personal target blood pressure may vary depending on your medical conditions, your age, and other factors. Follow these  instructions at home: Eating and drinking  Eat a diet that is high in fiber and potassium, and low in sodium, added sugar, and fat. An example eating plan is called the DASH (Dietary Approaches to Stop Hypertension) diet. To eat this way: ? Eat plenty of fresh fruits and vegetables. Try to fill half of your plate at each meal with fruits and vegetables. ? Eat whole grains, such as whole wheat pasta, brown rice, or whole grain bread. Fill about one quarter of your plate with whole grains. ? Eat or drink low-fat dairy products, such as skim milk or low-fat yogurt. ? Avoid fatty cuts of meat, processed or cured meats, and poultry with skin. Fill about one quarter of your plate with lean proteins, such as fish, chicken without skin, beans, eggs, and tofu. ? Avoid premade and processed foods. These tend to be higher in sodium, added sugar, and fat.  Reduce your daily sodium intake. Most people with hypertension should eat less than 1,500 mg of sodium a day.  Limit alcohol intake to no more than 1 drink a day for nonpregnant women and 2 drinks a day for men. One drink equals 12 oz of beer, 5 oz of wine, or 1 oz of hard liquor. Lifestyle  Work with your health care provider to maintain a healthy body weight or to lose weight. Ask what an ideal weight is for you.  Get at least 30 minutes of exercise that causes your heart to beat faster (aerobic exercise) most days of the week. Activities may include walking, swimming, or biking.  Include exercise to strengthen your muscles (resistance exercise), such as pilates or lifting weights, as part of your weekly exercise routine. Try to do these types of exercises for 30 minutes at least 3 days a week.  Do not use any products that contain nicotine or tobacco, such as cigarettes and e-cigarettes. If you need help quitting, ask your health care provider.  Monitor your blood pressure at home as told by your health care provider.  Keep all follow-up visits as  told by your health care provider. This is important. Medicines  Take over-the-counter and prescription medicines only as told by your health care provider. Follow directions carefully. Blood pressure medicines must be taken as prescribed.  Do not skip doses of blood pressure medicine. Doing this puts you at risk for problems and can make the medicine less effective.  Ask your health care provider about side effects or reactions to medicines that you should watch for. Contact a health care provider if:  You think you are having a reaction to a medicine you are taking.  You have headaches that keep coming back (recurring).  You feel dizzy.  You have swelling in your ankles.  You have trouble with your vision. Get help right away if:  You develop a severe headache or confusion.  You have unusual weakness or numbness.  You feel faint.  You have severe pain in your chest or abdomen.  You vomit repeatedly.  You have trouble breathing. Summary  Hypertension is when the force of blood pumping through your arteries is too strong. If this condition is not   controlled, it may put you at risk for serious complications.  Your personal target blood pressure may vary depending on your medical conditions, your age, and other factors. For most people, a normal blood pressure is less than 120/80.  Hypertension is treated with lifestyle changes, medicines, or a combination of both. Lifestyle changes include weight loss, eating a healthy, low-sodium diet, exercising more, and limiting alcohol. This information is not intended to replace advice given to you by your health care provider. Make sure you discuss any questions you have with your health care provider. Document Released: 10/08/2005 Document Revised: 09/05/2016 Document Reviewed: 09/05/2016 Elsevier Interactive Patient Education  2018 Elsevier Inc.    Lisinopril tablets What is this medicine? LISINOPRIL (lyse IN oh pril) is an ACE  inhibitor. This medicine is used to treat high blood pressure and heart failure. It is also used to protect the heart immediately after a heart attack. This medicine may be used for other purposes; ask your health care provider or pharmacist if you have questions. COMMON BRAND NAME(S): Prinivil, Zestril What should I tell my health care provider before I take this medicine? They need to know if you have any of these conditions: -diabetes -heart or blood vessel disease -kidney disease -low blood pressure -previous swelling of the tongue, face, or lips with difficulty breathing, difficulty swallowing, hoarseness, or tightening of the throat -an unusual or allergic reaction to lisinopril, other ACE inhibitors, insect venom, foods, dyes, or preservatives -pregnant or trying to get pregnant -breast-feeding How should I use this medicine? Take this medicine by mouth with a glass of water. Follow the directions on your prescription label. You may take this medicine with or without food. If it upsets your stomach, take it with food. Take your medicine at regular intervals. Do not take it more often than directed. Do not stop taking except on your doctor's advice. Talk to your pediatrician regarding the use of this medicine in children. Special care may be needed. While this drug may be prescribed for children as young as 6 years of age for selected conditions, precautions do apply. Overdosage: If you think you have taken too much of this medicine contact a poison control center or emergency room at once. NOTE: This medicine is only for you. Do not share this medicine with others. What if I miss a dose? If you miss a dose, take it as soon as you can. If it is almost time for your next dose, take only that dose. Do not take double or extra doses. What may interact with this medicine? Do not take this medicine with any of the following medications: -hymenoptera venom -sacubitril; valsartan This  medicines may also interact with the following medications: -aliskiren -angiotensin receptor blockers, like losartan or valsartan -certain medicines for diabetes -diuretics -everolimus -gold compounds -lithium -NSAIDs, medicines for pain and inflammation, like ibuprofen or naproxen -potassium salts or supplements -salt substitutes -sirolimus -temsirolimus This list may not describe all possible interactions. Give your health care provider a list of all the medicines, herbs, non-prescription drugs, or dietary supplements you use. Also tell them if you smoke, drink alcohol, or use illegal drugs. Some items may interact with your medicine. What should I watch for while using this medicine? Visit your doctor or health care professional for regular check ups. Check your blood pressure as directed. Ask your doctor what your blood pressure should be, and when you should contact him or her. Do not treat yourself for coughs, colds, or pain while you   are using this medicine without asking your doctor or health care professional for advice. Some ingredients may increase your blood pressure. Women should inform their doctor if they wish to become pregnant or think they might be pregnant. There is a potential for serious side effects to an unborn child. Talk to your health care professional or pharmacist for more information. Check with your doctor or health care professional if you get an attack of severe diarrhea, nausea and vomiting, or if you sweat a lot. The loss of too much body fluid can make it dangerous for you to take this medicine. You may get drowsy or dizzy. Do not drive, use machinery, or do anything that needs mental alertness until you know how this drug affects you. Do not stand or sit up quickly, especially if you are an older patient. This reduces the risk of dizzy or fainting spells. Alcohol can make you more drowsy and dizzy. Avoid alcoholic drinks. Avoid salt substitutes unless you are  told otherwise by your doctor or health care professional. What side effects may I notice from receiving this medicine? Side effects that you should report to your doctor or health care professional as soon as possible: -allergic reactions like skin rash, itching or hives, swelling of the hands, feet, face, lips, throat, or tongue -breathing problems -signs and symptoms of kidney injury like trouble passing urine or change in the amount of urine -signs and symptoms of increased potassium like muscle weakness; chest pain; or fast, irregular heartbeat -signs and symptoms of liver injury like dark yellow or brown urine; general ill feeling or flu-like symptoms; light-colored stools; loss of appetite; nausea; right upper belly pain; unusually weak or tired; yellowing of the eyes or skin -signs and symptoms of low blood pressure like dizziness; feeling faint or lightheaded, falls; unusually weak or tired -stomach pain with or without nausea and vomiting Side effects that usually do not require medical attention (report to your doctor or health care professional if they continue or are bothersome): -changes in taste -cough -dizziness -fever -headache -sensitivity to light This list may not describe all possible side effects. Call your doctor for medical advice about side effects. You may report side effects to FDA at 1-800-FDA-1088. Where should I keep my medicine? Keep out of the reach of children. Store at room temperature between 15 and 30 degrees C (59 and 86 degrees F). Protect from moisture. Keep container tightly closed. Throw away any unused medicine after the expiration date. NOTE: This sheet is a summary. It may not cover all possible information. If you have questions about this medicine, talk to your doctor, pharmacist, or health care provider.  2018 Elsevier/Gold Standard (2015-11-28 12:52:35)     IF you received an x-ray today, you will receive an invoice from [facility]. Please contact [facility] at [phone] with questions or concerns regarding your invoice.   IF you received labwork today, you will receive an invoice from [facility]. Please contact [facility] at [phone] with questions or concerns regarding your invoice.   Our billing staff will not be able to assist you with questions regarding bills from these companies.  You will be contacted with the lab results as soon as they are available. The fastest way to get your results is to activate your My Chart account. Instructions are located on the last page of this paperwork. If you have not heard from us regarding the results in 2 weeks, please contact this office.      

## 2018-01-09 NOTE — Progress Notes (Signed)
    MRN: [id number] DOB: 05/11/1971  Subjective:   Danny Garza is a 47 y.o. male presenting for follow up on Hypertension. Has been out of his blood pressure medications for ~1 year. Patient is checking blood pressure at home, generally 130's systolic. Avoids salt in diet, is exercising.  Denies lightheadedness, dizziness, chronic headache, blurred vision, chest pain, shortness of breath, heart racing, palpitations, nausea, vomiting, abdominal pain, hematuria, lower leg swelling. Denies smoking cigarettes. Has occasional drink of alcohol.   Danny Garza has a current medication list which includes the following prescription(s): cetirizine, hydrochlorothiazide, mometasone, and montelukast. Also has No Known Allergies.  Danny Garza  has a past medical history of Allergic rhinitis, Allergy, and HTN (hypertension). Denies past surgical history. His family history includes Cancer in his father; Heart disease in his mother; Hypertension in his maternal grandmother.   Objective:   Vitals: BP 136/80   Pulse 79   Temp 98.3 F (36.8 C) (Oral)   Resp 16   Ht 5\' 10"  (1.778 m)   Wt 198 lb 12.8 oz (90.2 kg)   SpO2 96%   BMI 28.52 kg/m   BP Readings from Last 3 Encounters:  01/09/18 136/80  09/01/15 126/85  07/21/15 123/80   Physical Exam  Constitutional: He is oriented to person, place, and time. He appears well-developed and well-nourished.  HENT:  Mouth/Throat: Oropharynx is clear and moist.  Eyes: Pupils are equal, round, and reactive to light. EOM are normal. No scleral icterus.  Neck: Normal range of motion. Neck supple. No thyromegaly present.  Cardiovascular: Normal rate, regular rhythm and intact distal pulses. Exam reveals no gallop and no friction rub.  No murmur heard. Pulmonary/Chest: No respiratory distress. He has no wheezes. He has no rales.  Musculoskeletal: He exhibits no edema.  Neurological: He is alert and oriented to person, place, and time.  Skin: Skin is warm and dry.    Psychiatric: He has a normal mood and affect.   Assessment and Plan :   Essential hypertension - Plan: Comprehensive metabolic panel, Lipid panel, DISCONTINUED: hydrochlorothiazide (MICROZIDE) 12.5 MG capsule  Patient will consider starting lisinopril. Will try to diet and exercise a bit more. Otherwise, follow up in 3 months for an annual physical and recheck BP at that point.  Wallis BambergMario Caroleen Stoermer, PA-C [facility] [phone] 01/09/2018  3:15 PM

## 2018-01-10 LAB — COMPREHENSIVE METABOLIC PANEL
A/G RATIO: 1.7 (ref 1.2–2.2)
ALBUMIN: 4.4 g/dL (ref 3.5–5.5)
ALT: 18 IU/L (ref 0–44)
AST: 20 IU/L (ref 0–40)
Alkaline Phosphatase: 88 IU/L (ref 39–117)
BUN / CREAT RATIO: 14 (ref 9–20)
BUN: 14 mg/dL (ref 6–24)
Bilirubin Total: <0.2 mg/dL (ref 0.0–1.2)
CALCIUM: 9.2 mg/dL (ref 8.7–10.2)
CO2: 22 mmol/L (ref 20–29)
CREATININE: 1.01 mg/dL (ref 0.76–1.27)
Chloride: 103 mmol/L (ref 96–106)
GFR, EST AFRICAN AMERICAN: 103 mL/min/{1.73_m2} (ref >59)
GFR, EST NON AFRICAN AMERICAN: 89 mL/min/{1.73_m2} (ref >59)
GLOBULIN, TOTAL: 2.6 g/dL (ref 1.5–4.5)
Glucose: 91 mg/dL (ref 65–99)
POTASSIUM: 4.4 mmol/L (ref 3.5–5.2)
SODIUM: 140 mmol/L (ref 134–144)
Total Protein: 7 g/dL (ref 6.0–8.5)

## 2018-01-10 LAB — LIPID PANEL
CHOL/HDL RATIO: 4.5 ratio (ref 0.0–5.0)
Cholesterol, Total: 172 mg/dL (ref 100–199)
HDL: 38 mg/dL — AB (ref >39)
LDL CALC: 100 mg/dL — AB (ref 0–99)
Triglycerides: 172 mg/dL — ABNORMAL HIGH (ref 0–149)
VLDL Cholesterol Cal: 34 mg/dL (ref 5–40)

## 2018-01-11 ENCOUNTER — Encounter: Payer: Self-pay | Admitting: Urgent Care

## 2018-01-17 ENCOUNTER — Telehealth: Payer: Self-pay

## 2018-01-17 NOTE — Telephone Encounter (Signed)
Microzide 12.5 MG capsule Rx was in Dr. Clelia CroftShaw box. I went ahead and called the Rx in for Mani/Davina.

## 2018-03-31 ENCOUNTER — Ambulatory Visit (INDEPENDENT_AMBULATORY_CARE_PROVIDER_SITE_OTHER): Payer: BLUE CROSS/BLUE SHIELD | Admitting: Urgent Care

## 2018-03-31 ENCOUNTER — Encounter: Payer: Self-pay | Admitting: Urgent Care

## 2018-03-31 VITALS — BP 134/89 | HR 89 | Temp 98.2°F | Resp 16 | Ht 70.0 in | Wt 189.4 lb

## 2018-03-31 DIAGNOSIS — I10 Essential (primary) hypertension: Secondary | ICD-10-CM

## 2018-03-31 DIAGNOSIS — Z1329 Encounter for screening for other suspected endocrine disorder: Secondary | ICD-10-CM

## 2018-03-31 DIAGNOSIS — Z13220 Encounter for screening for lipoid disorders: Secondary | ICD-10-CM

## 2018-03-31 DIAGNOSIS — Z0000 Encounter for general adult medical examination without abnormal findings: Secondary | ICD-10-CM

## 2018-03-31 DIAGNOSIS — Z13228 Encounter for screening for other metabolic disorders: Secondary | ICD-10-CM

## 2018-03-31 DIAGNOSIS — Z130 Encounter for screening for diseases of the blood and blood-forming organs and certain disorders involving the immune mechanism: Secondary | ICD-10-CM | POA: Diagnosis not present

## 2018-03-31 NOTE — Progress Notes (Signed)
MRN: [id number]  Subjective:   [name] is a 47 y.o. male presenting for annual physical exam.  Patient is single, works as a Airline pilotprofessor. Has good relationships at home, has a good support network.  Denies smoking cigarettes, has about 2 drinks of alcohol per week.  He declines STI testing today.  At his last office visit, we discussed management of hypertension.  He has been eating healthfully and exercising, staying active.  He has not yet started lisinopril.  Would like to hold off on this more and try to lose a little bit more weight.  Medical care team includes: PCP: Danny Garza, Danny Neidhardt, PA-C Vision: Just had his eye exam recently, now wears reading glasses. Dental: Gets dental cleanings once every 6 months. Specialists: Patient has a dermatologist, has an appointment scheduled in 2 months for consult on a skin lesion over left temporal scalp.  Health Maintenance: Age-appropriate screening includes updating immunizations, cholesterol, anemia and blood sugar screen.  Danny Garza has a current medication list which includes the following prescription(s): lisinopril. He has No Known Allergies. Danny Garza  has a past medical history of Allergic rhinitis, Allergy, and HTN (hypertension). Denies past surgical history. His family history includes Cancer in his father; Heart disease in his mother; Hypertension in his maternal grandmother. Father had skin cancer of his neck at age 46.  Immunizations: Tdap 09/01/2015.  Review of Systems  Constitutional: Negative for chills, diaphoresis, fever, malaise/fatigue and weight loss.  HENT: Negative for congestion, ear discharge, ear pain, hearing loss, nosebleeds, sore throat and tinnitus.   Eyes: Negative for blurred vision, double vision, photophobia, pain, discharge and redness.  Respiratory: Negative for cough, shortness of breath and wheezing.   Cardiovascular: Negative for chest pain, palpitations and leg swelling.  Gastrointestinal: Negative for  abdominal pain, blood in stool, constipation, diarrhea, nausea and vomiting.  Genitourinary: Negative for dysuria, flank pain, frequency, hematuria and urgency.  Musculoskeletal: Negative for back pain, joint pain and myalgias.  Skin: Negative for itching and rash.  Neurological: Negative for dizziness, tingling, seizures, loss of consciousness, weakness and headaches.  Endo/Heme/Allergies: Negative for polydipsia.  Psychiatric/Behavioral: Negative for depression, hallucinations, memory loss, substance abuse and suicidal ideas. The patient is not nervous/anxious and does not have insomnia.     Objective:   Vitals: BP 134/89   Pulse 89   Temp 98.2 F (36.8 C) (Oral)   Resp 16   Ht 5\' 10"  (1.778 m)   Wt 189 lb 6.4 oz (85.9 kg)   SpO2 97%   BMI 27.18 kg/m   BP Readings from Last 3 Encounters:  03/31/18 134/89  01/09/18 136/80  09/01/15 126/85   Physical Exam  Constitutional: He is oriented to person, place, and time. He appears well-developed and well-nourished.  HENT:  TM's intact bilaterally, no effusions or erythema. Nasal turbinates pink and moist, nasal passages patent. No sinus tenderness. Oropharynx clear, mucous membranes moist, dentition in good repair.  Eyes: Pupils are equal, round, and reactive to light. Conjunctivae and EOM are normal. Right eye exhibits no discharge. Left eye exhibits no discharge. No scleral icterus.  Neck: Normal range of motion. Neck supple. No thyromegaly present.  Cardiovascular: Normal rate, regular rhythm and intact distal pulses. Exam reveals no gallop and no friction rub.  No murmur heard. Pulmonary/Chest: No stridor. No respiratory distress. He has no wheezes. He has no rales.  Abdominal: Soft. Bowel sounds are normal. He exhibits no distension and no mass. There is no tenderness.  Musculoskeletal: Normal range of motion. He  exhibits no edema or tenderness.  Lymphadenopathy:    He has no cervical adenopathy.  Neurological: He is alert  and oriented to person, place, and time. He has normal reflexes. He displays normal reflexes. Coordination normal.  Skin: Skin is warm and dry. No rash noted. No erythema. No pallor.  Psychiatric: He has a normal mood and affect.    Assessment and Plan :   Annual physical exam  Screening for metabolic disorder - Plan: Comprehensive metabolic panel  Screening cholesterol level - Plan: Lipid panel  Screening for thyroid disorder - Plan: TSH  Screening for deficiency anemia - Plan: CBC  Essential hypertension - Plan: TSH  Patient is medically stable, very pleasant person.  Labs pending. Discussed healthy lifestyle, diet, exercise, preventative care, vaccinations, and addressed patient's concerns.  Meets borderline criteria regarding hypertension, patient will decide if he wants to start lisinopril in the future.  Otherwise he will follow-up in 1 year.  Danny Bamberg, PA-C Primary Care at [facility] [phone] 03/31/2018  8:47 AM

## 2018-03-31 NOTE — Patient Instructions (Signed)
Health Maintenance, Male A healthy lifestyle and preventive care is important for your health and wellness. Ask your health care provider about what schedule of regular examinations is right for you. What should I know about weight and diet? Eat a Healthy Diet  Eat plenty of vegetables, fruits, whole grains, low-fat dairy products, and lean protein.  Do not eat a lot of foods high in solid fats, added sugars, or salt.  Maintain a Healthy Weight Regular exercise can help you achieve or maintain a healthy weight. You should:  Do at least 150 minutes of exercise each week. The exercise should increase your heart rate and make you sweat (moderate-intensity exercise).  Do strength-training exercises at least twice a week.  Watch Your Levels of Cholesterol and Blood Lipids  Have your blood tested for lipids and cholesterol every 5 years starting at 46 years of age. If you are at high risk for heart disease, you should start having your blood tested when you are 46 years old. You may need to have your cholesterol levels checked more often if: ? Your lipid or cholesterol levels are high. ? You are older than 46 years of age. ? You are at high risk for heart disease.  What should I know about cancer screening? Many types of cancers can be detected early and may often be prevented. Lung Cancer  You should be screened every year for lung cancer if: ? You are a current smoker who has smoked for at least 30 years. ? You are a former smoker who has quit within the past 15 years.  Talk to your health care provider about your screening options, when you should start screening, and how often you should be screened.  Colorectal Cancer  Routine colorectal cancer screening usually begins at 46 years of age and should be repeated every 5-10 years until you are 46 years old. You may need to be screened more often if early forms of precancerous polyps or small growths are found. Your health care provider  may recommend screening at an earlier age if you have risk factors for colon cancer.  Your health care provider may recommend using home test kits to check for hidden blood in the stool.  A small camera at the end of a tube can be used to examine your colon (sigmoidoscopy or colonoscopy). This checks for the earliest forms of colorectal cancer.  Prostate and Testicular Cancer  Depending on your age and overall health, your health care provider may do certain tests to screen for prostate and testicular cancer.  Talk to your health care provider about any symptoms or concerns you have about testicular or prostate cancer.  Skin Cancer  Check your skin from head to toe regularly.  Tell your health care provider about any new moles or changes in moles, especially if: ? There is a change in a mole's size, shape, or color. ? You have a mole that is larger than a pencil eraser.  Always use sunscreen. Apply sunscreen liberally and repeat throughout the day.  Protect yourself by wearing long sleeves, pants, a wide-brimmed hat, and sunglasses when outside.  What should I know about heart disease, diabetes, and high blood pressure?  If you are 18-39 years of age, have your blood pressure checked every 3-5 years. If you are 40 years of age or older, have your blood pressure checked every year. You should have your blood pressure measured twice-once when you are at a hospital or clinic, and once when   you are not at a hospital or clinic. Record the average of the two measurements. To check your blood pressure when you are not at a hospital or clinic, you can use: ? An automated blood pressure machine at a pharmacy. ? A home blood pressure monitor.  Talk to your health care provider about your target blood pressure.  If you are between 45-79 years old, ask your health care provider if you should take aspirin to prevent heart disease.  Have regular diabetes screenings by checking your fasting blood  sugar level. ? If you are at a normal weight and have a low risk for diabetes, have this test once every three years after the age of 45. ? If you are overweight and have a high risk for diabetes, consider being tested at a younger age or more often.  A one-time screening for abdominal aortic aneurysm (AAA) by ultrasound is recommended for men aged 65-75 years who are current or former smokers. What should I know about preventing infection? Hepatitis B If you have a higher risk for hepatitis B, you should be screened for this virus. Talk with your health care provider to find out if you are at risk for hepatitis B infection. Hepatitis C Blood testing is recommended for:  Everyone born from 1945 through 1965.  Anyone with known risk factors for hepatitis C.  Sexually Transmitted Diseases (STDs)  You should be screened each year for STDs including gonorrhea and chlamydia if: ? You are sexually active and are younger than 46 years of age. ? You are older than 46 years of age and your health care provider tells you that you are at risk for this type of infection. ? Your sexual activity has changed since you were last screened and you are at an increased risk for chlamydia or gonorrhea. Ask your health care provider if you are at risk.  Talk with your health care provider about whether you are at high risk of being infected with HIV. Your health care provider may recommend a prescription medicine to help prevent HIV infection.  What else can I do?  Schedule regular health, dental, and eye exams.  Stay current with your vaccines (immunizations).  Do not use any tobacco products, such as cigarettes, chewing tobacco, and e-cigarettes. If you need help quitting, ask your health care provider.  Limit alcohol intake to no more than 2 drinks per day. One drink equals 12 ounces of beer, 5 ounces of wine, or 1 ounces of hard liquor.  Do not use street drugs.  Do not share needles.  Ask your  health care provider for help if you need support or information about quitting drugs.  Tell your health care provider if you often feel depressed.  Tell your health care provider if you have ever been abused or do not feel safe at home. This information is not intended to replace advice given to you by your health care provider. Make sure you discuss any questions you have with your health care provider. Document Released: 04/05/2008 Document Revised: 06/06/2016 Document Reviewed: 07/12/2015 Elsevier Interactive Patient Education  2018 [employer]vier Inc[employer]     IF you received an x-ray today, you will receive an invoice from [facility]. Please contact [facility] at [phone] with questions or concerns regarding your invoice.   IF you received labwork today, you will receive an invoice from [facility]. Please contact [facility] at [phone] with questions or concerns regarding your invoice.   Our billing staff will not be   able to assist you with questions regarding bills from these companies.  You will be contacted with the lab results as soon as they are available. The fastest way to get your results is to activate your My Chart account. Instructions are located on the last page of this paperwork. If you have not heard from us regarding the results in 2 weeks, please contact this office.       

## 2018-04-01 LAB — CBC
HEMATOCRIT: 36.7 % — AB (ref 37.5–51.0)
HEMOGLOBIN: 11.3 g/dL — AB (ref 13.0–17.7)
MCH: 21.4 pg — ABNORMAL LOW (ref 26.6–33.0)
MCHC: 30.8 g/dL — ABNORMAL LOW (ref 31.5–35.7)
MCV: 70 fL — ABNORMAL LOW (ref 79–97)
Platelets: 352 10*3/uL (ref 150–450)
RBC: 5.27 x10E6/uL (ref 4.14–5.80)
RDW: 18 % — AB (ref 12.3–15.4)
WBC: 6.6 10*3/uL (ref 3.4–10.8)

## 2018-04-01 LAB — COMPREHENSIVE METABOLIC PANEL
ALBUMIN: 4.5 g/dL (ref 3.5–5.5)
ALT: 26 IU/L (ref 0–44)
AST: 24 IU/L (ref 0–40)
Albumin/Globulin Ratio: 2 (ref 1.2–2.2)
Alkaline Phosphatase: 99 IU/L (ref 39–117)
BUN/Creatinine Ratio: 10 (ref 9–20)
BUN: 11 mg/dL (ref 6–24)
Bilirubin Total: 0.3 mg/dL (ref 0.0–1.2)
CALCIUM: 8.7 mg/dL (ref 8.7–10.2)
CO2: 21 mmol/L (ref 20–29)
CREATININE: 1.11 mg/dL (ref 0.76–1.27)
Chloride: 103 mmol/L (ref 96–106)
GFR calc Af Amer: 92 mL/min/{1.73_m2} (ref >59)
GFR, EST NON AFRICAN AMERICAN: 79 mL/min/{1.73_m2} (ref >59)
GLUCOSE: 97 mg/dL (ref 65–99)
Globulin, Total: 2.3 g/dL (ref 1.5–4.5)
Potassium: 4.1 mmol/L (ref 3.5–5.2)
Sodium: 140 mmol/L (ref 134–144)
Total Protein: 6.8 g/dL (ref 6.0–8.5)

## 2018-04-01 LAB — LIPID PANEL
CHOL/HDL RATIO: 4.4 ratio (ref 0.0–5.0)
CHOLESTEROL TOTAL: 170 mg/dL (ref 100–199)
HDL: 39 mg/dL — ABNORMAL LOW (ref >39)
LDL CALC: 110 mg/dL — AB (ref 0–99)
TRIGLYCERIDES: 103 mg/dL (ref 0–149)
VLDL CHOLESTEROL CAL: 21 mg/dL (ref 5–40)

## 2018-04-01 LAB — TSH: TSH: 3.73 u[IU]/mL (ref 0.450–4.500)

## 2018-04-02 DIAGNOSIS — E291 Testicular hypofunction: Secondary | ICD-10-CM | POA: Diagnosis not present

## 2018-04-15 ENCOUNTER — Encounter: Payer: BLUE CROSS/BLUE SHIELD | Admitting: Urgent Care

## 2018-05-01 ENCOUNTER — Ambulatory Visit (INDEPENDENT_AMBULATORY_CARE_PROVIDER_SITE_OTHER): Payer: BLUE CROSS/BLUE SHIELD | Admitting: Urgent Care

## 2018-05-01 ENCOUNTER — Encounter: Payer: Self-pay | Admitting: Urgent Care

## 2018-05-01 VITALS — BP 134/89 | HR 85 | Temp 98.6°F | Resp 16 | Ht 70.0 in | Wt 188.0 lb

## 2018-05-01 DIAGNOSIS — D649 Anemia, unspecified: Secondary | ICD-10-CM | POA: Insufficient documentation

## 2018-05-01 DIAGNOSIS — R1013 Epigastric pain: Secondary | ICD-10-CM

## 2018-05-01 DIAGNOSIS — Z130 Encounter for screening for diseases of the blood and blood-forming organs and certain disorders involving the immune mechanism: Secondary | ICD-10-CM | POA: Diagnosis not present

## 2018-05-01 HISTORY — DX: Anemia, unspecified: D64.9

## 2018-05-01 NOTE — Progress Notes (Signed)
    MRN: [id number] DOB: 06/19/1971  Subjective:   Danny Garza is a 47 y.o. male presenting for follow-up on anemia as seen in his last office visit, annual physical on 03/31/2018.  Patient reports that he has had intermittent epigastric discomfort but not pain.  He also reports that he chews on ice very frequently.  Denies fever, fatigue, dizziness, numbness or tingling, chest tightness, shortness of breath, heart racing, palpitations, bleeding gums, easily bruising, pale skin.  Denies history of difficulty with anemia.  He is not currently taking any medications.  Molly MaduroRobert has a current medication list which includes the following prescription(s): lisinopril. Also has No Known Allergies.  Molly MaduroRobert  has a past medical history of Allergic rhinitis, Allergy, and HTN (hypertension). Denies past surgical history.   Objective:   Vitals: BP 134/89   Pulse 85   Temp 98.6 F (37 C) (Oral)   Resp 16   Ht 5\' 10"  (1.778 m)   Wt 188 lb (85.3 kg)   SpO2 98%   BMI 26.98 kg/m   Physical Exam  Constitutional: He is oriented to person, place, and time. He appears well-developed and well-nourished.  HENT:  Mouth/Throat: Oropharynx is clear and moist.  Eyes: Right eye exhibits no discharge. Left eye exhibits no discharge. No scleral icterus.  Cardiovascular: Normal rate, regular rhythm, normal heart sounds and intact distal pulses. Exam reveals no gallop and no friction rub.  No murmur heard. Pulmonary/Chest: Effort normal and breath sounds normal. No stridor. No respiratory distress. He has no wheezes. He has no rales.  Abdominal: Soft. Bowel sounds are normal. He exhibits no distension and no mass. There is no tenderness. There is no rebound and no guarding.  Neurological: He is alert and oriented to person, place, and time.  Skin: Skin is warm and dry. Capillary refill takes less than 2 seconds.  Psychiatric: He has a normal mood and affect.   Assessment and Plan :   Anemia, unspecified type -  Plan: Pathologist smear review, CBC, Iron, TIBC and Ferritin Panel, POC Hemoccult Bld/Stl (3-Cd Home Screen), H. pylori breath test  Epigastric discomfort - Plan: H. pylori breath test  Labs pending we will hold off on any treatments for now and we will base treatment off of the results including referral to hematology or gastroenterology.  Wallis BambergMario Zymeir Salminen, PA-C Primary Care at [facility] [phone] 05/01/2018  10:19 AM

## 2018-05-01 NOTE — Patient Instructions (Signed)
Anemia Anemia is a condition in which you do not have enough red blood cells or hemoglobin. Hemoglobin is a substance in red blood cells that carries oxygen. When you do not have enough red blood cells or hemoglobin (are anemic), your body cannot get enough oxygen and your organs may not work properly. As a result, you may feel very tired or have other problems. What are the causes? Common causes of anemia include:  Excessive bleeding. Anemia can be caused by excessive bleeding inside or outside the body, including bleeding from the intestine or from periods in women.  Poor nutrition.  Long-lasting (chronic) kidney, thyroid, and liver disease.  Bone marrow disorders.  Cancer and treatments for cancer.  HIV (human immunodeficiency virus) and AIDS (acquired immunodeficiency syndrome).  Treatments for HIV and AIDS.  Spleen problems.  Blood disorders.  Infections, medicines, and autoimmune disorders that destroy red blood cells.  What are the signs or symptoms? Symptoms of this condition include:  Minor weakness.  Dizziness.  Headache.  Feeling heartbeats that are irregular or faster than normal (palpitations).  Shortness of breath, especially with exercise.  Paleness.  Cold sensitivity.  Indigestion.  Nausea.  Difficulty sleeping.  Difficulty concentrating.  Symptoms may occur suddenly or develop slowly. If your anemia is mild, you may not have symptoms. How is this diagnosed? This condition is diagnosed based on:  Blood tests.  Your medical history.  A physical exam.  Bone marrow biopsy.  Your health care provider may also check your stool (feces) for blood and may do additional testing to look for the cause of your bleeding. You may also have other tests, including:  Imaging tests, such as a CT scan or MRI.  Endoscopy.  Colonoscopy.  How is this treated? Treatment for this condition depends on the cause. If you continue to lose a lot of blood,  you may need to be treated at a hospital. Treatment may include:  Taking supplements of iron, vitamin B12, or folic acid.  Taking a hormone medicine (erythropoietin) that can help to stimulate red blood cell growth.  Having a blood transfusion. This may be needed if you lose a lot of blood.  Making changes to your diet.  Having surgery to remove your spleen.  Follow these instructions at home:  Take over-the-counter and prescription medicines only as told by your health care provider.  Take supplements only as told by your health care provider.  Follow any diet instructions that you were given.  Keep all follow-up visits as told by your health care provider. This is important. Contact a health care provider if:  You develop new bleeding anywhere in the body. Get help right away if:  You are very weak.  You are short of breath.  You have pain in your abdomen or chest.  You are dizzy or feel faint.  You have trouble concentrating.  You have bloody or black, tarry stools.  You vomit repeatedly or you vomit up blood. Summary  Anemia is a condition in which you do not have enough red blood cells or enough of a substance in your red blood cells that carries oxygen (hemoglobin).  Symptoms may occur suddenly or develop slowly.  If your anemia is mild, you may not have symptoms.  This condition is diagnosed with blood tests as well as a medical history and physical exam. Other tests may be needed.  Treatment for this condition depends on the cause of the anemia. This information is not intended to replace advice   given to you by your health care provider. Make sure you discuss any questions you have with your health care provider. Document Released: 11/15/2004 Document Revised: 11/09/2016 Document Reviewed: 11/09/2016 Elsevier Interactive Patient Education  2018 [employer].     IF you received an x-ray today, you will receive an invoice from [facility].  Please contact [facility] at [phone] with questions or concerns regarding your invoice.   IF you received labwork today, you will receive an invoice from [place]. Please contact [facility] at [phone] with questions or concerns regarding your invoice.   Our billing staff will not be able to assist you with questions regarding bills from these companies.  You will be contacted with the lab results as soon as they are available. The fastest way to get your results is to activate your My Chart account. Instructions are located on the last page of this paperwork. If you have not heard from [place] regarding the results in 2 weeks, please contact this office.

## 2018-05-03 LAB — H. PYLORI BREATH TEST: H PYLORI BREATH TEST: NEGATIVE

## 2018-05-06 ENCOUNTER — Other Ambulatory Visit: Payer: Self-pay | Admitting: Urgent Care

## 2018-05-06 MED ORDER — FERROUS SULFATE 325 (65 FE) MG PO TABS: 325.0000 mg | ORAL_TABLET | Freq: Two times a day (BID) | ORAL | 1 refills | Status: DC

## 2018-05-09 ENCOUNTER — Other Ambulatory Visit: Payer: Self-pay | Admitting: Urgent Care

## 2018-05-09 DIAGNOSIS — D509 Iron deficiency anemia, unspecified: Secondary | ICD-10-CM

## 2018-05-09 DIAGNOSIS — D649 Anemia, unspecified: Secondary | ICD-10-CM

## 2018-05-09 DIAGNOSIS — R1013 Epigastric pain: Secondary | ICD-10-CM

## 2018-05-09 LAB — PATHOLOGIST SMEAR REVIEW
Basophils Absolute: 0 10*3/uL (ref 0.0–0.2)
Basos: 1 %
EOS (ABSOLUTE): 0.3 10*3/uL (ref 0.0–0.4)
EOS: 4 %
HEMATOCRIT: 38.1 % (ref 37.5–51.0)
HEMOGLOBIN: 11.4 g/dL — AB (ref 13.0–17.7)
IMMATURE GRANULOCYTES: 0 %
Immature Grans (Abs): 0 10*3/uL (ref 0.0–0.1)
LYMPHS ABS: 2.9 10*3/uL (ref 0.7–3.1)
Lymphs: 45 %
MCH: 21.3 pg — ABNORMAL LOW (ref 26.6–33.0)
MCHC: 29.9 g/dL — AB (ref 31.5–35.7)
MCV: 71 fL — AB (ref 79–97)
MONOS ABS: 0.6 10*3/uL (ref 0.1–0.9)
Monocytes: 9 %
NEUTROS ABS: 2.6 10*3/uL (ref 1.4–7.0)
NEUTROS PCT: 41 %
PLATELETS: 326 10*3/uL (ref 150–450)
Path Rev PLTs: NORMAL
Path Rev WBC: NORMAL
RBC: 5.36 x10E6/uL (ref 4.14–5.80)
RDW: 17.5 % — AB (ref 12.3–15.4)
WBC: 6.3 10*3/uL (ref 3.4–10.8)

## 2018-05-09 LAB — IRON,TIBC AND FERRITIN PANEL
Ferritin: 7 ng/mL — ABNORMAL LOW (ref 30–400)
IRON SATURATION: 6 % — AB (ref 15–55)
Iron: 30 ug/dL — ABNORMAL LOW (ref 38–169)
Total Iron Binding Capacity: 463 ug/dL — ABNORMAL HIGH (ref 250–450)
UIBC: 433 ug/dL — ABNORMAL HIGH (ref 111–343)

## 2018-05-12 LAB — POC HEMOCCULT BLD/STL (HOME/3-CARD/SCREEN)
Card #3 Fecal Occult Blood, POC: NEGATIVE
FECAL OCCULT BLD: NEGATIVE
Fecal Occult Blood, POC: NEGATIVE

## 2018-05-12 NOTE — Addendum Note (Signed)
Addended by: Rogelia RohrerMCADOO, Xiao Graul K on: 05/12/2018 03:16 PM   Modules accepted: Orders

## 2018-06-02 DIAGNOSIS — D509 Iron deficiency anemia, unspecified: Secondary | ICD-10-CM | POA: Diagnosis not present

## 2018-06-10 DIAGNOSIS — L738 Other specified follicular disorders: Secondary | ICD-10-CM | POA: Diagnosis not present

## 2018-06-10 DIAGNOSIS — D485 Neoplasm of uncertain behavior of skin: Secondary | ICD-10-CM | POA: Diagnosis not present

## 2018-06-17 ENCOUNTER — Encounter: Payer: Self-pay | Admitting: Urgent Care

## 2018-06-17 DIAGNOSIS — D122 Benign neoplasm of ascending colon: Secondary | ICD-10-CM | POA: Diagnosis not present

## 2018-06-17 DIAGNOSIS — K635 Polyp of colon: Secondary | ICD-10-CM | POA: Diagnosis not present

## 2018-06-17 DIAGNOSIS — D125 Benign neoplasm of sigmoid colon: Secondary | ICD-10-CM | POA: Diagnosis not present

## 2018-06-17 DIAGNOSIS — D124 Benign neoplasm of descending colon: Secondary | ICD-10-CM | POA: Diagnosis not present

## 2018-06-17 DIAGNOSIS — D509 Iron deficiency anemia, unspecified: Secondary | ICD-10-CM | POA: Diagnosis not present

## 2019-02-20 ENCOUNTER — Telehealth: Payer: Self-pay | Admitting: Urgent Care

## 2019-02-20 MED ORDER — LISINOPRIL 10 MG PO TABS: 10.0000 mg | ORAL_TABLET | Freq: Every day | ORAL | 0 refills | Status: DC

## 2019-02-20 NOTE — Telephone Encounter (Signed)
Copied from CRM [id number]. Topic: Quick Communication - Rx Refill/Question >> Feb 20, 2019  2:13 PM Mickel Baas B, NT wrote: Medication: lisinopril (PRINIVIL,ZESTRIL) 10 MG tablet  Has the patient contacted their pharmacy? yes (Agent: If no, request that the patient contact the pharmacy for the refill.) (Agent: If yes, when and what did the pharmacy advise?)  Preferred Pharmacy (with phone number or street name): [id number] - [place], [place] - [address] AT [facility] [address]  Agent: Please be advised that RX refills may take up to 3 business days. We ask that you follow-up with your pharmacy.

## 2019-02-21 ENCOUNTER — Other Ambulatory Visit: Payer: Self-pay | Admitting: Family Medicine

## 2019-02-21 MED ORDER — LISINOPRIL 10 MG PO TABS: 10.0000 mg | ORAL_TABLET | Freq: Every day | ORAL | 0 refills | Status: DC

## 2019-02-23 ENCOUNTER — Encounter: Payer: Self-pay | Admitting: Urgent Care

## 2019-03-24 ENCOUNTER — Other Ambulatory Visit: Payer: Self-pay | Admitting: Family Medicine

## 2019-03-24 MED ORDER — LISINOPRIL 10 MG PO TABS: 10.0000 mg | ORAL_TABLET | Freq: Every day | ORAL | 0 refills | Status: DC

## 2019-04-20 DIAGNOSIS — Z20828 Contact with and (suspected) exposure to other viral communicable diseases: Secondary | ICD-10-CM | POA: Diagnosis not present

## 2019-05-11 ENCOUNTER — Other Ambulatory Visit: Payer: Self-pay | Admitting: Family Medicine

## 2019-05-25 ENCOUNTER — Other Ambulatory Visit: Payer: Self-pay

## 2019-05-25 ENCOUNTER — Ambulatory Visit (INDEPENDENT_AMBULATORY_CARE_PROVIDER_SITE_OTHER): Payer: BC Managed Care – PPO | Admitting: Registered Nurse

## 2019-05-25 ENCOUNTER — Encounter: Payer: Self-pay | Admitting: Registered Nurse

## 2019-05-25 VITALS — BP 130/82 | HR 85 | Temp 98.5°F | Resp 16 | Wt 185.0 lb

## 2019-05-25 DIAGNOSIS — Z0001 Encounter for general adult medical examination with abnormal findings: Secondary | ICD-10-CM

## 2019-05-25 DIAGNOSIS — I10 Essential (primary) hypertension: Secondary | ICD-10-CM

## 2019-05-25 DIAGNOSIS — Z1329 Encounter for screening for other suspected endocrine disorder: Secondary | ICD-10-CM | POA: Diagnosis not present

## 2019-05-25 DIAGNOSIS — Z13220 Encounter for screening for lipoid disorders: Secondary | ICD-10-CM | POA: Diagnosis not present

## 2019-05-25 DIAGNOSIS — D649 Anemia, unspecified: Secondary | ICD-10-CM

## 2019-05-25 DIAGNOSIS — Z13228 Encounter for screening for other metabolic disorders: Secondary | ICD-10-CM | POA: Diagnosis not present

## 2019-05-25 DIAGNOSIS — Z0000 Encounter for general adult medical examination without abnormal findings: Secondary | ICD-10-CM | POA: Diagnosis not present

## 2019-05-25 DIAGNOSIS — Z130 Encounter for screening for diseases of the blood and blood-forming organs and certain disorders involving the immune mechanism: Secondary | ICD-10-CM

## 2019-05-25 MED ORDER — FERROUS SULFATE 325 (65 FE) MG PO TABS: 325.0000 mg | ORAL_TABLET | Freq: Two times a day (BID) | ORAL | 3 refills | Status: DC

## 2019-05-25 MED ORDER — LISINOPRIL 10 MG PO TABS: 10.0000 mg | ORAL_TABLET | Freq: Every day | ORAL | 3 refills | Status: DC

## 2019-05-25 NOTE — Patient Instructions (Signed)
° ° ° °  If you have lab work done today you will be contacted with your lab results within the next 2 weeks.  If you have not heard from us then please contact us. The fastest way to get your results is to register for My Chart. ° ° °IF you received an x-ray today, you will receive an invoice from [facility]. Please contact [facility] at [phone] with questions or concerns regarding your invoice.  ° °IF you received labwork today, you will receive an invoice from [facility]. Please contact [facility] at [phone] with questions or concerns regarding your invoice.  ° °Our billing staff will not be able to assist you with questions regarding bills from these companies. ° °You will be contacted with the lab results as soon as they are available. The fastest way to get your results is to activate your My Chart account. Instructions are located on the last page of this paperwork. If you have not heard from us regarding the results in 2 weeks, please contact this office. °  ° ° ° °

## 2019-05-25 NOTE — Progress Notes (Signed)
Established Patient Office Visit  Subjective:  Patient ID: Danny Garza, male    DOB: [birth year]  Age: 48 y.o. MRN: [id number]  CC:  Chief Complaint  Patient presents with  . Establish Care    pt need new pcp to manage medications     HPI Danny Garza presents for CPE and medication refills  Formerly seen by Rosario Adie PA-C. It was discovered that he had IDA, and was put on iron supplements. He states he has lost the desire to chew on ice, which was his biggest presenting symptom. At this point we will do follow up iron studies, but he had completed colonoscopy, endoscopy, FOBT with normal results, at this time, likely dietary iron deficiency. Will encourage him to continue supplement  Otherwise, takes lisinopril 10mg  PO qd for HTN. Generally well controlled, but has been out of his medication for 2 weeks at this time.   Past Medical History:  Diagnosis Date  . Allergic rhinitis   . Allergy   . HTN (hypertension)     History reviewed. No pertinent surgical history.  Family History  Problem Relation Age of Onset  . Lung cancer Father        throat secondary to tobacco use  . Hypertension Maternal Grandmother   . Heart disease Mother        CABG in 85's    Social History   Socioeconomic History  . Marital status: Single    Spouse name: Not on file  . Number of children: Not on file  . Years of education: Not on file  . Highest education level: Not on file  Occupational History  . Occupation: chaplain/professor  Social Needs  . Financial resource strain: Not hard at all  . Food insecurity    Worry: Never true    Inability: Never true  . Transportation needs    Medical: No    Non-medical: No  Tobacco Use  . Smoking status: Never Smoker  . Smokeless tobacco: Never Used  Substance and Sexual Activity  . Alcohol use: Yes    Alcohol/week: 2.0 standard drinks    Types: 2 Standard drinks or equivalent per week    Comment: BEER  . Drug use: No  . Sexual  activity: Not Currently  Lifestyle  . Physical activity    Days per week: 5 days    Minutes per session: 60 min  . Stress: Only a little  Relationships  . Social Herbalist on phone: Three times a week    Gets together: Twice a week    Attends religious service: More than 4 times per year    Active member of club or organization: Not on file    Attends meetings of clubs or organizations: More than 4 times per year    Relationship status: Patient refused  . Intimate partner violence    Fear of current or ex partner: No    Emotionally abused: No    Physically abused: No    Forced sexual activity: No  Other Topics Concern  . Not on file  Social History Narrative   Exercise cardio and weights 4 times a week    Outpatient Medications Prior to Visit  Medication Sig Dispense Refill  . ferrous sulfate 325 (65 FE) MG tablet Take 1 tablet (325 mg total) by mouth 2 (two) times daily with a meal. 60 tablet 1  . lisinopril (ZESTRIL) 10 MG tablet Take 1 tablet (10 mg total) by mouth daily. [place]  tablet 0   No facility-administered medications prior to visit.     No Known Allergies  ROS Review of Systems  Constitutional: Negative.   HENT: Negative.   Eyes: Negative.   Respiratory: Negative.   Cardiovascular: Negative.   Gastrointestinal: Negative.   Endocrine: Negative.   Genitourinary: Negative.   Musculoskeletal: Negative.   Skin: Negative.   Allergic/Immunologic: Negative.   Neurological: Negative.   Hematological: Negative.   Psychiatric/Behavioral: Negative.   All other systems reviewed and are negative.     Objective:    Physical Exam  Constitutional: He is oriented to person, place, and time. He appears well-developed and well-nourished. No distress.  HENT:  Head: Normocephalic and atraumatic.  Right Ear: External ear normal.  Left Ear: External ear normal.  Nose: Nose normal.  Mouth/Throat: Oropharynx is clear and moist. No oropharyngeal exudate.   Eyes: Pupils are equal, round, and reactive to light. Conjunctivae are normal. Right eye exhibits no discharge. Left eye exhibits no discharge. No scleral icterus.  Neck: Normal range of motion. Neck supple. No JVD present. No tracheal deviation present. No thyromegaly present.  Cardiovascular: Normal rate, regular rhythm, normal heart sounds and intact distal pulses. Exam reveals no gallop and no friction rub.  No murmur heard. Pulmonary/Chest: Effort normal and breath sounds normal. No stridor. No respiratory distress. He has no wheezes. He has no rales. He exhibits no tenderness.  Abdominal: Soft. Normal appearance and bowel sounds are normal. He exhibits no distension and no mass. There is no abdominal tenderness. There is no rebound and no guarding.  Musculoskeletal: Normal range of motion.        General: No tenderness, deformity or edema.  Lymphadenopathy:    He has no cervical adenopathy.  Neurological: He is alert and oriented to person, place, and time. No cranial nerve deficit. He exhibits normal muscle tone. Coordination normal.  Skin: Skin is warm and dry. No rash noted. He is not diaphoretic. No erythema. No pallor.  Psychiatric: He has a normal mood and affect. His behavior is normal. Judgment and thought content normal.  Nursing note and vitals reviewed.   BP (!) 144/90   Pulse 85   Temp 98.5 F (36.9 C) (Oral)   Resp 16   Wt 185 lb (83.9 kg)   SpO2 98%   BMI 26.54 kg/m  Wt Readings from Last 3 Encounters:  05/25/19 185 lb (83.9 kg)  05/01/18 188 lb (85.3 kg)  03/31/18 189 lb 6.4 oz (85.9 kg)     Health Maintenance Due  Topic Date Due  . INFLUENZA VACCINE  05/23/2019    There are no preventive care reminders to display for this patient.  Lab Results  Component Value Date   TSH 3.730 03/31/2018   Lab Results  Component Value Date   WBC 6.3 05/01/2018   HGB 11.4 (L) 05/01/2018   HCT 38.1 05/01/2018   MCV 71 (L) 05/01/2018   PLT 326 05/01/2018   Lab  Results  Component Value Date   NA 140 03/31/2018   K 4.1 03/31/2018   CO2 21 03/31/2018   GLUCOSE 97 03/31/2018   BUN 11 03/31/2018   CREATININE 1.11 03/31/2018   BILITOT 0.3 03/31/2018   ALKPHOS 99 03/31/2018   AST 24 03/31/2018   ALT 26 03/31/2018   PROT 6.8 03/31/2018   ALBUMIN 4.5 03/31/2018   CALCIUM 8.7 03/31/2018   Lab Results  Component Value Date   CHOL 170 03/31/2018   Lab Results  Component Value Date  HDL 39 (L) 03/31/2018   Lab Results  Component Value Date   LDLCALC 110 (H) 03/31/2018   Lab Results  Component Value Date   TRIG 103 03/31/2018   Lab Results  Component Value Date   CHOLHDL 4.4 03/31/2018   No results found for: HGBA1C    Assessment & Plan:   Problem List Items Addressed This Visit      Other   Anemia - Primary   Relevant Orders   CBC with Differential/Platelet   Comprehensive metabolic panel   Hemoglobin A1c   Lipid panel   Iron, TIBC and Ferritin Panel    Other Visit Diagnoses    Annual physical exam       Relevant Orders   CBC with Differential/Platelet   Comprehensive metabolic panel   Hemoglobin A1c   Lipid panel   Iron, TIBC and Ferritin Panel   Screening for endocrine, metabolic and immunity disorder       Relevant Orders   CBC with Differential/Platelet   Comprehensive metabolic panel   Hemoglobin A1c   Iron, TIBC and Ferritin Panel   Lipid screening       Relevant Orders   Lipid panel      No orders of the defined types were placed in this encounter.   Follow-up: No follow-ups on file.   PLAN  Exam with normal findings  Labs drawn, will follow up as warranted.  Meds refilled x 1 year  Patient encouraged to call clinic with any questions, comments, or concerns.   Janeece Ageeichard Mckaila Duffus, NP

## 2019-05-26 LAB — LIPID PANEL
Chol/HDL Ratio: 4.3 ratio (ref 0.0–5.0)
Cholesterol, Total: 167 mg/dL (ref 100–199)
HDL: 39 mg/dL — ABNORMAL LOW (ref >39)
LDL Calculated: 96 mg/dL (ref 0–99)
Triglycerides: 159 mg/dL — ABNORMAL HIGH (ref 0–149)
VLDL Cholesterol Cal: 32 mg/dL (ref 5–40)

## 2019-05-26 LAB — CBC WITH DIFFERENTIAL/PLATELET
Basophils Absolute: 0 10*3/uL (ref 0.0–0.2)
Basos: 0 %
EOS (ABSOLUTE): 0.2 10*3/uL (ref 0.0–0.4)
Eos: 3 %
Hematocrit: 41.5 % (ref 37.5–51.0)
Hemoglobin: 13.8 g/dL (ref 13.0–17.7)
Immature Grans (Abs): 0 10*3/uL (ref 0.0–0.1)
Immature Granulocytes: 0 %
Lymphocytes Absolute: 3.3 10*3/uL — ABNORMAL HIGH (ref 0.7–3.1)
Lymphs: 48 %
MCH: 25.9 pg — ABNORMAL LOW (ref 26.6–33.0)
MCHC: 33.3 g/dL (ref 31.5–35.7)
MCV: 78 fL — ABNORMAL LOW (ref 79–97)
Monocytes Absolute: 0.6 10*3/uL (ref 0.1–0.9)
Monocytes: 9 %
Neutrophils Absolute: 2.8 10*3/uL (ref 1.4–7.0)
Neutrophils: 40 %
Platelets: 317 10*3/uL (ref 150–450)
RBC: 5.33 x10E6/uL (ref 4.14–5.80)
RDW: 15.1 % (ref 11.6–15.4)
WBC: 6.8 10*3/uL (ref 3.4–10.8)

## 2019-05-26 LAB — HEMOGLOBIN A1C
Est. average glucose Bld gHb Est-mCnc: 114 mg/dL
Hgb A1c MFr Bld: 5.6 % (ref 4.8–5.6)

## 2019-05-26 LAB — COMPREHENSIVE METABOLIC PANEL
ALT: 16 IU/L (ref 0–44)
AST: 19 IU/L (ref 0–40)
Albumin/Globulin Ratio: 1.9 (ref 1.2–2.2)
Albumin: 4.6 g/dL (ref 4.0–5.0)
Alkaline Phosphatase: 91 IU/L (ref 39–117)
BUN/Creatinine Ratio: 13 (ref 9–20)
BUN: 14 mg/dL (ref 6–24)
Bilirubin Total: <0.2 mg/dL (ref 0.0–1.2)
CO2: 21 mmol/L (ref 20–29)
Calcium: 8.7 mg/dL (ref 8.7–10.2)
Chloride: 104 mmol/L (ref 96–106)
Creatinine, Ser: 1.09 mg/dL (ref 0.76–1.27)
GFR calc Af Amer: 93 mL/min/{1.73_m2} (ref >59)
GFR calc non Af Amer: 80 mL/min/{1.73_m2} (ref >59)
Globulin, Total: 2.4 g/dL (ref 1.5–4.5)
Glucose: 131 mg/dL — ABNORMAL HIGH (ref 65–99)
Potassium: 4.1 mmol/L (ref 3.5–5.2)
Sodium: 137 mmol/L (ref 134–144)
Total Protein: 7 g/dL (ref 6.0–8.5)

## 2019-05-26 LAB — IRON,TIBC AND FERRITIN PANEL
Ferritin: 9 ng/mL — ABNORMAL LOW (ref 30–400)
Iron Saturation: 7 % — CL (ref 15–55)
Iron: 30 ug/dL — ABNORMAL LOW (ref 38–169)
Total Iron Binding Capacity: 434 ug/dL (ref 250–450)
UIBC: 404 ug/dL — ABNORMAL HIGH (ref 111–343)

## 2019-05-26 NOTE — Progress Notes (Signed)
Good morning Mr. Nunley- Overall, your lab results are looking decent. We're seeing mild changes in some white blood cell counts, mild elevations in triglycerides and glucose (likely due to eating before labs), but overall, they're mostly fine. Your hemoglobin (hgb) has improved to normal limits, however, when we look at the Iron Studies (Iron, TIBC, and Ferritin Panel), we're seeing only mild improvements from last year. Combine this with the MCV (size of red blood cells) and MCH (density of hemoglobin in the red blood cells), we can see a mirror of previous trends of iron deficiency anemia. Overall, your counts are improving, but I would encourage you to continue taking the iron supplement daily to continue bringing these up. There are no urgent concerns here, as we're heading in a positive direction and you reported that you're feeling fine, but I am positive the supplement would continue this trend and get you back to normal limits.  Thank you,  Kathrin Ruddy, NP

## 2019-06-11 DIAGNOSIS — L570 Actinic keratosis: Secondary | ICD-10-CM | POA: Diagnosis not present

## 2019-06-11 DIAGNOSIS — D225 Melanocytic nevi of trunk: Secondary | ICD-10-CM | POA: Diagnosis not present

## 2019-06-11 DIAGNOSIS — D1801 Hemangioma of skin and subcutaneous tissue: Secondary | ICD-10-CM | POA: Diagnosis not present

## 2019-07-22 DIAGNOSIS — Z23 Encounter for immunization: Secondary | ICD-10-CM | POA: Diagnosis not present

## 2019-12-29 ENCOUNTER — Encounter: Payer: Self-pay | Admitting: Registered Nurse

## 2019-12-30 ENCOUNTER — Other Ambulatory Visit: Payer: Self-pay | Admitting: Registered Nurse

## 2019-12-30 DIAGNOSIS — I10 Essential (primary) hypertension: Secondary | ICD-10-CM

## 2019-12-30 MED ORDER — AMLODIPINE BESYLATE 10 MG PO TABS: 10.0000 mg | ORAL_TABLET | Freq: Every day | ORAL | 3 refills | Status: DC

## 2019-12-30 NOTE — Progress Notes (Signed)
Pt having dry cough with lisinopril - requesting switch Will start him on amlodipine 10mg  PO qd Discussed cough may linger for a while after stopping lisinopril Return to clinic in August or Sept for CPE and labs  08-10[birth year]1977, NP

## 2019-12-30 NOTE — Telephone Encounter (Signed)
Patient state that the lisinopril gives him a slight cough and wanted to know if there was another medication he can try before he get the refill of Lisinopril.  Please Advise.

## 2020-02-03 ENCOUNTER — Encounter: Payer: Self-pay | Admitting: Registered Nurse

## 2020-02-03 ENCOUNTER — Other Ambulatory Visit: Payer: Self-pay | Admitting: Registered Nurse

## 2020-02-03 DIAGNOSIS — I10 Essential (primary) hypertension: Secondary | ICD-10-CM

## 2020-02-03 MED ORDER — HYDROCHLOROTHIAZIDE 25 MG PO TABS: 25.0000 mg | ORAL_TABLET | Freq: Every day | ORAL | 3 refills | Status: DC

## 2020-02-03 NOTE — Telephone Encounter (Signed)
Patient states that he is having some hand and foot swelling from the new blood pressure medication.

## 2020-03-01 ENCOUNTER — Telehealth: Payer: Self-pay | Admitting: Registered Nurse

## 2020-03-01 DIAGNOSIS — Z0000 Encounter for general adult medical examination without abnormal findings: Secondary | ICD-10-CM

## 2020-03-01 NOTE — Telephone Encounter (Signed)
Please order labs for physical coming up on  03/18/2020

## 2020-03-15 ENCOUNTER — Ambulatory Visit (INDEPENDENT_AMBULATORY_CARE_PROVIDER_SITE_OTHER): Payer: 59 | Admitting: Registered Nurse

## 2020-03-15 ENCOUNTER — Other Ambulatory Visit: Payer: Self-pay

## 2020-03-15 DIAGNOSIS — Z0000 Encounter for general adult medical examination without abnormal findings: Secondary | ICD-10-CM

## 2020-03-16 LAB — CBC WITH DIFFERENTIAL/PLATELET
Basophils Absolute: 0.1 10*3/uL (ref 0.0–0.2)
Basos: 1 %
EOS (ABSOLUTE): 0.3 10*3/uL (ref 0.0–0.4)
Eos: 3 %
Hematocrit: 50 % (ref 37.5–51.0)
Hemoglobin: 17.3 g/dL (ref 13.0–17.7)
Immature Grans (Abs): 0 10*3/uL (ref 0.0–0.1)
Immature Granulocytes: 0 %
Lymphocytes Absolute: 3.5 10*3/uL — ABNORMAL HIGH (ref 0.7–3.1)
Lymphs: 42 %
MCH: 31.1 pg (ref 26.6–33.0)
MCHC: 34.6 g/dL (ref 31.5–35.7)
MCV: 90 fL (ref 79–97)
Monocytes Absolute: 0.7 10*3/uL (ref 0.1–0.9)
Monocytes: 9 %
Neutrophils Absolute: 3.9 10*3/uL (ref 1.4–7.0)
Neutrophils: 45 %
Platelets: 280 10*3/uL (ref 150–450)
RBC: 5.56 x10E6/uL (ref 4.14–5.80)
RDW: 12.9 % (ref 11.6–15.4)
WBC: 8.5 10*3/uL (ref 3.4–10.8)

## 2020-03-16 LAB — CMP14+EGFR
ALT: 42 IU/L (ref 0–44)
AST: 25 IU/L (ref 0–40)
Albumin/Globulin Ratio: 2 (ref 1.2–2.2)
Albumin: 4.5 g/dL (ref 4.0–5.0)
Alkaline Phosphatase: 102 IU/L (ref 48–121)
BUN/Creatinine Ratio: 14 (ref 9–20)
BUN: 17 mg/dL (ref 6–24)
Bilirubin Total: 0.5 mg/dL (ref 0.0–1.2)
CO2: 23 mmol/L (ref 20–29)
Calcium: 9.4 mg/dL (ref 8.7–10.2)
Chloride: 103 mmol/L (ref 96–106)
Creatinine, Ser: 1.19 mg/dL (ref 0.76–1.27)
GFR calc Af Amer: 83 mL/min/{1.73_m2} (ref >59)
GFR calc non Af Amer: 72 mL/min/{1.73_m2} (ref >59)
Globulin, Total: 2.3 g/dL (ref 1.5–4.5)
Glucose: 98 mg/dL (ref 65–99)
Potassium: 4.1 mmol/L (ref 3.5–5.2)
Sodium: 141 mmol/L (ref 134–144)
Total Protein: 6.8 g/dL (ref 6.0–8.5)

## 2020-03-16 LAB — LIPID PANEL
Chol/HDL Ratio: 4.8 ratio (ref 0.0–5.0)
Cholesterol, Total: 196 mg/dL (ref 100–199)
HDL: 41 mg/dL (ref >39)
LDL Chol Calc (NIH): 133 mg/dL — ABNORMAL HIGH (ref 0–99)
Triglycerides: 123 mg/dL (ref 0–149)
VLDL Cholesterol Cal: 22 mg/dL (ref 5–40)

## 2020-03-16 LAB — HEMOGLOBIN A1C
Est. average glucose Bld gHb Est-mCnc: 108 mg/dL
Hgb A1c MFr Bld: 5.4 % (ref 4.8–5.6)

## 2020-03-18 ENCOUNTER — Ambulatory Visit (INDEPENDENT_AMBULATORY_CARE_PROVIDER_SITE_OTHER): Payer: 59 | Admitting: Registered Nurse

## 2020-03-18 ENCOUNTER — Encounter: Payer: Self-pay | Admitting: Registered Nurse

## 2020-03-18 ENCOUNTER — Other Ambulatory Visit: Payer: Self-pay

## 2020-03-18 VITALS — BP 133/87 | HR 77 | Temp 97.2°F | Resp 17 | Ht 71.0 in | Wt 190.8 lb

## 2020-03-18 DIAGNOSIS — D649 Anemia, unspecified: Secondary | ICD-10-CM | POA: Diagnosis not present

## 2020-03-18 DIAGNOSIS — Z0001 Encounter for general adult medical examination with abnormal findings: Secondary | ICD-10-CM | POA: Diagnosis not present

## 2020-03-18 DIAGNOSIS — I10 Essential (primary) hypertension: Secondary | ICD-10-CM

## 2020-03-18 DIAGNOSIS — Z0000 Encounter for general adult medical examination without abnormal findings: Secondary | ICD-10-CM

## 2020-03-18 MED ORDER — HYDROCHLOROTHIAZIDE 25 MG PO TABS: 25.0000 mg | ORAL_TABLET | Freq: Every day | ORAL | 3 refills | Status: DC

## 2020-03-18 MED ORDER — FERROUS SULFATE 325 (65 FE) MG PO TABS: 325.0000 mg | ORAL_TABLET | Freq: Two times a day (BID) | ORAL | 3 refills | Status: DC

## 2020-03-18 NOTE — Progress Notes (Signed)
Established Patient Office Visit  Subjective:  Patient ID: Danny Garza, male    DOB: 09/03/1971  Age: 49 y.o. MRN: [id number]  CC:  Chief Complaint  Patient presents with  . Annual Exam    patient is here to discuss lab work done 03/15/2020 and also a physical    HPI Danny Garza presents for CPE and to discuss recent labs  Labs collected on 03/15/20. No major concerns. LDL elevated to 133 - diet and exercise. Anemia is not noted on CBC. Otherwise CMP and a1c wnl.  Hx of anemia- taking fe supplement daily with good effect, feeling well overall. Mild constipation from fe, but taking fiber supplement to help with this with good effect. No concerns.  Hx of htn. BP wnl today. Taking HCTZ 25mg  PO qd. No concerns. Denies sxs of htn.   Past Medical History:  Diagnosis Date  . Allergic rhinitis   . Allergy   . HTN (hypertension)     No past surgical history on file.  Family History  Problem Relation Age of Onset  . Lung cancer Father        throat secondary to tobacco use  . Hypertension Maternal Grandmother   . Heart disease Mother        CABG in 36's    Social History   Socioeconomic History  . Marital status: Single    Spouse name: Not on file  . Number of children: Not on file  . Years of education: Not on file  . Highest education level: Not on file  Occupational History  . Occupation: chaplain/professor  Tobacco Use  . Smoking status: Never Smoker  . Smokeless tobacco: Never Used  Substance and Sexual Activity  . Alcohol use: Yes    Alcohol/week: 2.0 standard drinks    Types: 2 Standard drinks or equivalent per week    Comment: BEER  . Drug use: No  . Sexual activity: Not Currently  Other Topics Concern  . Not on file  Social History Narrative   Exercise cardio and weights 4 times a week   Social Determinants of Health   Financial Resource Strain: Low Risk   . Difficulty of Paying Living Expenses: Not hard at all  Food Insecurity: No Food  Insecurity  . Worried About Charity fundraiser in the Last Year: Never true  . Ran Out of Food in the Last Year: Never true  Transportation Needs: No Transportation Needs  . Lack of Transportation (Medical): No  . Lack of Transportation (Non-Medical): No  Physical Activity: Sufficiently Active  . Days of Exercise per Week: 5 days  . Minutes of Exercise per Session: 60 min  Stress: No Stress Concern Present  . Feeling of Stress : Only a little  Social Connections: Unknown  . Frequency of Communication with Friends and Family: Three times a week  . Frequency of Social Gatherings with Friends and Family: Twice a week  . Attends Religious Services: More than 4 times per year  . Active Member of Clubs or Organizations: Not on file  . Attends Archivist Meetings: More than 4 times per year  . Marital Status: Patient refused  Intimate Partner Violence: Not At Risk  . Fear of Current or Ex-Partner: No  . Emotionally Abused: No  . Physically Abused: No  . Sexually Abused: No    Outpatient Medications Prior to Visit  Medication Sig Dispense Refill  . ferrous sulfate 325 (65 FE) MG tablet Take 1 tablet (325 mg  total) by mouth 2 (two) times daily with a meal. 90 tablet 3  . hydrochlorothiazide (HYDRODIURIL) 25 MG tablet Take 1 tablet (25 mg total) by mouth daily. 90 tablet 3  . amLODipine (NORVASC) 10 MG tablet Take 1 tablet (10 mg total) by mouth daily. (Patient not taking: Reported on 03/18/2020) 90 tablet 3  . lisinopril (ZESTRIL) 10 MG tablet Take 1 tablet (10 mg total) by mouth daily. (Patient not taking: Reported on 03/18/2020) 90 tablet 3   No facility-administered medications prior to visit.    No Known Allergies  ROS Review of Systems  Constitutional: Negative.   HENT: Negative.   Eyes: Negative.   Respiratory: Negative.   Cardiovascular: Negative.   Gastrointestinal: Negative.   Endocrine: Negative.   Genitourinary: Negative.   Musculoskeletal: Negative.   Skin:  Negative.   Allergic/Immunologic: Negative.   Neurological: Negative.   Hematological: Negative.   Psychiatric/Behavioral: Negative.   All other systems reviewed and are negative.     Objective:    Physical Exam  Constitutional: He is oriented to person, place, and time. He appears well-developed and well-nourished. No distress.  HENT:  Head: Normocephalic and atraumatic.  Right Ear: External ear normal.  Left Ear: External ear normal.  Nose: Nose normal.  Mouth/Throat: Oropharynx is clear and moist. No oropharyngeal exudate.  Eyes: Pupils are equal, round, and reactive to light. Conjunctivae and EOM are normal. Right eye exhibits no discharge. Left eye exhibits no discharge. No scleral icterus.  Neck: No JVD present. No tracheal deviation present. No thyromegaly present.  Cardiovascular: Normal rate, regular rhythm, normal heart sounds and intact distal pulses. Exam reveals no gallop and no friction rub.  No murmur heard. Pulmonary/Chest: Effort normal and breath sounds normal. No respiratory distress. He has no wheezes. He has no rales. He exhibits no tenderness.  Abdominal: Soft. Normal appearance, normal aorta and bowel sounds are normal. He exhibits no shifting dullness, no distension, no pulsatile liver and no mass. There is no hepatosplenomegaly, splenomegaly or hepatomegaly. There is no abdominal tenderness. There is no rebound, no guarding and no CVA tenderness.  Incidental finding of umbilical hernia unchanged in appearance since last year's CPE.   Musculoskeletal:        General: No tenderness, deformity or edema. Normal range of motion.     Cervical back: Normal range of motion and neck supple.  Lymphadenopathy:    He has no cervical adenopathy.  Neurological: He is alert and oriented to person, place, and time. No cranial nerve deficit. He exhibits normal muscle tone. Coordination normal.  Skin: Skin is warm and dry. No rash noted. He is not diaphoretic. No erythema. No  pallor.  Psychiatric: He has a normal mood and affect. His behavior is normal. Judgment and thought content normal.  Nursing note and vitals reviewed.   BP 133/87   Pulse 77   Temp (!) 97.2 F (36.2 C) (Temporal)   Resp 17   Ht 5\' 11"  (1.803 m)   Wt 190 lb 12.8 oz (86.5 kg)   SpO2 94%   BMI 26.61 kg/m  Wt Readings from Last 3 Encounters:  03/18/20 190 lb 12.8 oz (86.5 kg)  05/25/19 185 lb (83.9 kg)  05/01/18 188 lb (85.3 kg)     There are no preventive care reminders to display for this patient.  There are no preventive care reminders to display for this patient.  Lab Results  Component Value Date   TSH 3.730 03/31/2018   Lab Results  Component  Value Date   WBC 8.5 03/15/2020   HGB 17.3 03/15/2020   HCT 50.0 03/15/2020   MCV 90 03/15/2020   PLT 280 03/15/2020   Lab Results  Component Value Date   NA 141 03/15/2020   K 4.1 03/15/2020   CO2 23 03/15/2020   GLUCOSE 98 03/15/2020   BUN 17 03/15/2020   CREATININE 1.19 03/15/2020   BILITOT 0.5 03/15/2020   ALKPHOS 102 03/15/2020   AST 25 03/15/2020   ALT 42 03/15/2020   PROT 6.8 03/15/2020   ALBUMIN 4.5 03/15/2020   CALCIUM 9.4 03/15/2020   Lab Results  Component Value Date   CHOL 196 03/15/2020   Lab Results  Component Value Date   HDL 41 03/15/2020   Lab Results  Component Value Date   LDLCALC 133 (H) 03/15/2020   Lab Results  Component Value Date   TRIG 123 03/15/2020   Lab Results  Component Value Date   CHOLHDL 4.8 03/15/2020   Lab Results  Component Value Date   HGBA1C 5.4 03/15/2020      Assessment & Plan:   Problem List Items Addressed This Visit      Cardiovascular and Mediastinum   Essential hypertension   Relevant Medications   hydrochlorothiazide (HYDRODIURIL) 25 MG tablet     Other   Anemia   Relevant Medications   ferrous sulfate 325 (65 FE) MG tablet    Other Visit Diagnoses    Annual physical exam    -  Primary      No orders of the defined types were placed  in this encounter.   Follow-up: No follow-ups on file.   PLAN  Umbilical hernia stable  Otherwise unremarkable exam  Lab work reviewed - diet and exercise to address LDL, otherwise, no concerns  Return in 1 year for CPE  Patient encouraged to call clinic with any questions, comments, or concerns.  Janeece Agee, NP

## 2020-03-18 NOTE — Patient Instructions (Signed)
° ° ° °  If you have lab work done today you will be contacted with your lab results within the next 2 weeks.  If you have not heard from us then please contact us. The fastest way to get your results is to register for My Chart. ° ° °IF you received an x-ray today, you will receive an invoice from [facility]. Please contact [facility] at [phone] with questions or concerns regarding your invoice.  ° °IF you received labwork today, you will receive an invoice from [facility]. Please contact [facility] at [phone] with questions or concerns regarding your invoice.  ° °Our billing staff will not be able to assist you with questions regarding bills from these companies. ° °You will be contacted with the lab results as soon as they are available. The fastest way to get your results is to activate your My Chart account. Instructions are located on the last page of this paperwork. If you have not heard from us regarding the results in 2 weeks, please contact this office. °  ° ° ° °

## 2020-12-06 ENCOUNTER — Encounter: Payer: Self-pay | Admitting: Registered Nurse

## 2020-12-06 ENCOUNTER — Other Ambulatory Visit: Payer: Self-pay

## 2020-12-06 ENCOUNTER — Ambulatory Visit (INDEPENDENT_AMBULATORY_CARE_PROVIDER_SITE_OTHER): Payer: 59 | Admitting: Registered Nurse

## 2020-12-06 VITALS — BP 134/82 | HR 112 | Temp 98.0°F | Resp 18 | Ht 71.0 in | Wt 201.2 lb

## 2020-12-06 DIAGNOSIS — R5382 Chronic fatigue, unspecified: Secondary | ICD-10-CM | POA: Diagnosis not present

## 2020-12-06 DIAGNOSIS — Z862 Personal history of diseases of the blood and blood-forming organs and certain disorders involving the immune mechanism: Secondary | ICD-10-CM

## 2020-12-06 NOTE — Progress Notes (Signed)
Established Patient Office Visit  Subjective:  Patient ID: Danny Garza, male    DOB: [birth year]  Age: 50 y.o. MRN: [id number]  CC:  Chief Complaint  Patient presents with  . Hypertension    Patient states he is here because he went to the dentist and his BP was elevated wanted to follow up with PCP.    HPI Danny Garza presents for elevated bp  History significant for HTN Notes that at the dentist for routine cleaning recently, bp was 160/100 Having some fatigue lately, but otherwise no CV symptoms: denies doe, shob, chest pain, headache, visual changes claudication, and dependent edema. Has been taking hctz with good compliance No known hx of white coat htn, no fear of dentist Otherwise feeling well  Past Medical History:  Diagnosis Date  . Allergic rhinitis   . Allergy   . HTN (hypertension)     No past surgical history on file.  Family History  Problem Relation Age of Onset  . Lung cancer Father        throat secondary to tobacco use  . Hypertension Maternal Grandmother   . Heart disease Mother        CABG in 57's    Social History   Socioeconomic History  . Marital status: Single    Spouse name: Not on file  . Number of children: Not on file  . Years of education: Not on file  . Highest education level: Not on file  Occupational History  . Occupation: chaplain/professor  Tobacco Use  . Smoking status: Never Smoker  . Smokeless tobacco: Never Used  Vaping Use  . Vaping Use: Never used  Substance and Sexual Activity  . Alcohol use: Yes    Alcohol/week: 2.0 standard drinks    Types: 2 Standard drinks or equivalent per week    Comment: BEER  . Drug use: No  . Sexual activity: Not Currently  Other Topics Concern  . Not on file  Social History Narrative   Exercise cardio and weights 4 times a week   Social Determinants of Health   Financial Resource Strain: Not on file  Food Insecurity: Not on file  Transportation Needs: Not on file  Physical  Activity: Not on file  Stress: Not on file  Social Connections: Not on file  Intimate Partner Violence: Not on file    Outpatient Medications Prior to Visit  Medication Sig Dispense Refill  . ferrous sulfate 325 (65 FE) MG tablet Take 1 tablet (325 mg total) by mouth 2 (two) times daily with a meal. 180 tablet 3  . hydrochlorothiazide (HYDRODIURIL) 25 MG tablet Take 1 tablet (25 mg total) by mouth daily. 90 tablet 3   No facility-administered medications prior to visit.    No Known Allergies  ROS Review of Systems  Constitutional: Positive for fatigue.  HENT: Negative.   Eyes: Negative.   Respiratory: Negative.   Cardiovascular: Negative.   Gastrointestinal: Negative.   Genitourinary: Negative.   Musculoskeletal: Negative.   Skin: Negative.   Neurological: Negative.   Psychiatric/Behavioral: Negative.   All other systems reviewed and are negative.     Objective:    Physical Exam Constitutional:      General: He is not in acute distress.    Appearance: Normal appearance. He is normal weight. He is not ill-appearing, toxic-appearing or diaphoretic.  Cardiovascular:     Rate and Rhythm: Normal rate and regular rhythm.     Heart sounds: Normal heart sounds. No murmur heard. No  friction rub. No gallop.   Pulmonary:     Effort: Pulmonary effort is normal. No respiratory distress.     Breath sounds: Normal breath sounds. No stridor. No wheezing, rhonchi or rales.  Chest:     Chest wall: No tenderness.  Neurological:     General: No focal deficit present.     Mental Status: He is alert and oriented to person, place, and time. Mental status is at baseline.  Psychiatric:        Mood and Affect: Mood normal.        Behavior: Behavior normal.        Thought Content: Thought content normal.        Judgment: Judgment normal.     BP 134/82 (BP Location: Left Arm, Patient Position: Sitting, Cuff Size: Large)   Pulse (!) 112   Temp 98 F (36.7 C) (Temporal)   Resp 18    Ht 5\' 11"  (1.803 m)   Wt 201 lb 3.2 oz (91.3 kg)   SpO2 94%   BMI 28.06 kg/m  Wt Readings from Last 3 Encounters:  12/06/20 201 lb 3.2 oz (91.3 kg)  03/18/20 190 lb 12.8 oz (86.5 kg)  05/25/19 185 lb (83.9 kg)     There are no preventive care reminders to display for this patient.  There are no preventive care reminders to display for this patient.  Lab Results  Component Value Date   TSH 3.730 03/31/2018   Lab Results  Component Value Date   WBC 8.5 03/15/2020   HGB 17.3 03/15/2020   HCT 50.0 03/15/2020   MCV 90 03/15/2020   PLT 280 03/15/2020   Lab Results  Component Value Date   NA 141 03/15/2020   K 4.1 03/15/2020   CO2 23 03/15/2020   GLUCOSE 98 03/15/2020   BUN 17 03/15/2020   CREATININE 1.19 03/15/2020   BILITOT 0.5 03/15/2020   ALKPHOS 102 03/15/2020   AST 25 03/15/2020   ALT 42 03/15/2020   PROT 6.8 03/15/2020   ALBUMIN 4.5 03/15/2020   CALCIUM 9.4 03/15/2020   Lab Results  Component Value Date   CHOL 196 03/15/2020   Lab Results  Component Value Date   HDL 41 03/15/2020   Lab Results  Component Value Date   LDLCALC 133 (H) 03/15/2020   Lab Results  Component Value Date   TRIG 123 03/15/2020   Lab Results  Component Value Date   CHOLHDL 4.8 03/15/2020   Lab Results  Component Value Date   HGBA1C 5.4 03/15/2020      Assessment & Plan:   Problem List Items Addressed This Visit   None   Visit Diagnoses    Chronic fatigue    -  Primary   Relevant Orders   Vitamin D, 25-hydroxy   Vitamin B12   CBC   Iron, TIBC and Ferritin Panel   Basic Metabolic Panel   TSH   History of anemia       Relevant Orders   Vitamin D, 25-hydroxy   Vitamin B12   CBC   Iron, TIBC and Ferritin Panel   Basic Metabolic Panel      No orders of the defined types were placed in this encounter.   Follow-up: No follow-ups on file.   PLAN  Will check a few labs to investigate for other cause of fatigue, doubt CV origin  Exam  reassuring  Recheck on bp wnl  Continue current meds, follow up as planned  Patient encouraged to  call clinic with any questions, comments, or concerns.  Maximiano Coss, NP

## 2020-12-06 NOTE — Patient Instructions (Signed)
° ° ° °  If you have lab work done today you will be contacted with your lab results within the next 2 weeks.  If you have not heard from us then please contact us. The fastest way to get your results is to register for My Chart. ° ° °IF you received an x-ray today, you will receive an invoice from [facility]. Please contact [facility] at [phone] with questions or concerns regarding your invoice.  ° °IF you received labwork today, you will receive an invoice from [facility]. Please contact [facility] at [phone] with questions or concerns regarding your invoice.  ° °Our billing staff will not be able to assist you with questions regarding bills from these companies. ° °You will be contacted with the lab results as soon as they are available. The fastest way to get your results is to activate your My Chart account. Instructions are located on the last page of this paperwork. If you have not heard from us regarding the results in 2 weeks, please contact this office. °  ° ° ° °

## 2020-12-07 ENCOUNTER — Other Ambulatory Visit: Payer: Self-pay | Admitting: Registered Nurse

## 2020-12-07 DIAGNOSIS — E559 Vitamin D deficiency, unspecified: Secondary | ICD-10-CM

## 2020-12-07 LAB — CBC
Hematocrit: 53.2 % — ABNORMAL HIGH (ref 37.5–51.0)
Hemoglobin: 17.9 g/dL — ABNORMAL HIGH (ref 13.0–17.7)
MCH: 30.2 pg (ref 26.6–33.0)
MCHC: 33.6 g/dL (ref 31.5–35.7)
MCV: 90 fL (ref 79–97)
Platelets: 288 10*3/uL (ref 150–450)
RBC: 5.93 x10E6/uL — ABNORMAL HIGH (ref 4.14–5.80)
RDW: 13.3 % (ref 11.6–15.4)
WBC: 7.4 10*3/uL (ref 3.4–10.8)

## 2020-12-07 LAB — BASIC METABOLIC PANEL
BUN/Creatinine Ratio: 15 (ref 9–20)
BUN: 15 mg/dL (ref 6–24)
CO2: 20 mmol/L (ref 20–29)
Calcium: 9.4 mg/dL (ref 8.7–10.2)
Chloride: 99 mmol/L (ref 96–106)
Creatinine, Ser: 1.03 mg/dL (ref 0.76–1.27)
GFR calc Af Amer: 98 mL/min/{1.73_m2} (ref >59)
GFR calc non Af Amer: 85 mL/min/{1.73_m2} (ref >59)
Glucose: 89 mg/dL (ref 65–99)
Potassium: 4 mmol/L (ref 3.5–5.2)
Sodium: 139 mmol/L (ref 134–144)

## 2020-12-07 LAB — IRON,TIBC AND FERRITIN PANEL
Ferritin: 42 ng/mL (ref 30–400)
Iron Saturation: 45 % (ref 15–55)
Iron: 174 ug/dL — ABNORMAL HIGH (ref 38–169)
Total Iron Binding Capacity: 385 ug/dL (ref 250–450)
UIBC: 211 ug/dL (ref 111–343)

## 2020-12-07 LAB — VITAMIN B12: Vitamin B-12: 377 pg/mL (ref 232–1245)

## 2020-12-07 LAB — VITAMIN D 25 HYDROXY (VIT D DEFICIENCY, FRACTURES): Vit D, 25-Hydroxy: 16.2 ng/mL — ABNORMAL LOW (ref 30.0–100.0)

## 2020-12-07 LAB — TSH: TSH: 2.49 u[IU]/mL (ref 0.450–4.500)

## 2020-12-07 MED ORDER — VITAMIN D (ERGOCALCIFEROL) 1.25 MG (50000 UNIT) PO CAPS: 50000.0000 [IU] | ORAL_CAPSULE | ORAL | 0 refills | Status: DC

## 2021-03-21 ENCOUNTER — Other Ambulatory Visit: Payer: Self-pay

## 2021-03-21 ENCOUNTER — Ambulatory Visit (INDEPENDENT_AMBULATORY_CARE_PROVIDER_SITE_OTHER): Payer: 59 | Admitting: Registered Nurse

## 2021-03-21 ENCOUNTER — Encounter: Payer: Self-pay | Admitting: Registered Nurse

## 2021-03-21 VITALS — BP 135/89 | HR 80 | Temp 98.0°F | Resp 18 | Ht 71.0 in | Wt 197.0 lb

## 2021-03-21 DIAGNOSIS — Z0000 Encounter for general adult medical examination without abnormal findings: Secondary | ICD-10-CM | POA: Diagnosis not present

## 2021-03-21 DIAGNOSIS — Z13228 Encounter for screening for other metabolic disorders: Secondary | ICD-10-CM | POA: Diagnosis not present

## 2021-03-21 DIAGNOSIS — E559 Vitamin D deficiency, unspecified: Secondary | ICD-10-CM | POA: Diagnosis not present

## 2021-03-21 DIAGNOSIS — Z130 Encounter for screening for diseases of the blood and blood-forming organs and certain disorders involving the immune mechanism: Secondary | ICD-10-CM

## 2021-03-21 DIAGNOSIS — Z1329 Encounter for screening for other suspected endocrine disorder: Secondary | ICD-10-CM | POA: Diagnosis not present

## 2021-03-21 DIAGNOSIS — I10 Essential (primary) hypertension: Secondary | ICD-10-CM

## 2021-03-21 DIAGNOSIS — Z862 Personal history of diseases of the blood and blood-forming organs and certain disorders involving the immune mechanism: Secondary | ICD-10-CM | POA: Diagnosis not present

## 2021-03-21 DIAGNOSIS — M722 Plantar fascial fibromatosis: Secondary | ICD-10-CM

## 2021-03-21 DIAGNOSIS — Z13220 Encounter for screening for lipoid disorders: Secondary | ICD-10-CM

## 2021-03-21 DIAGNOSIS — Z79899 Other long term (current) drug therapy: Secondary | ICD-10-CM

## 2021-03-21 LAB — CBC WITH DIFFERENTIAL/PLATELET
Basophils Absolute: 0 10*3/uL (ref 0.0–0.1)
Basophils Relative: 0.4 % (ref 0.0–3.0)
Eosinophils Absolute: 0.2 10*3/uL (ref 0.0–0.7)
Eosinophils Relative: 2.4 % (ref 0.0–5.0)
HCT: 47.6 % (ref 39.0–52.0)
Hemoglobin: 16.3 g/dL (ref 13.0–17.0)
Lymphocytes Relative: 37.9 % (ref 12.0–46.0)
Lymphs Abs: 2.5 10*3/uL (ref 0.7–4.0)
MCHC: 34.2 g/dL (ref 30.0–36.0)
MCV: 89.7 fl (ref 78.0–100.0)
Monocytes Absolute: 0.5 10*3/uL (ref 0.1–1.0)
Monocytes Relative: 8.2 % (ref 3.0–12.0)
Neutro Abs: 3.3 10*3/uL (ref 1.4–7.7)
Neutrophils Relative %: 51.1 % (ref 43.0–77.0)
Platelets: 272 10*3/uL (ref 150.0–400.0)
RBC: 5.31 Mil/uL (ref 4.22–5.81)
RDW: 13.6 % (ref 11.5–15.5)
WBC: 6.5 10*3/uL (ref 4.0–10.5)

## 2021-03-21 LAB — COMPREHENSIVE METABOLIC PANEL
ALT: 33 U/L (ref 0–53)
AST: 23 U/L (ref 0–37)
Albumin: 4.4 g/dL (ref 3.5–5.2)
Alkaline Phosphatase: 84 U/L (ref 39–117)
BUN: 14 mg/dL (ref 6–23)
CO2: 28 mEq/L (ref 19–32)
Calcium: 9 mg/dL (ref 8.4–10.5)
Chloride: 103 mEq/L (ref 96–112)
Creatinine, Ser: 1.11 mg/dL (ref 0.40–1.50)
GFR: 78.09 mL/min (ref >60.00)
Glucose, Bld: 104 mg/dL — ABNORMAL HIGH (ref 70–99)
Potassium: 3.7 mEq/L (ref 3.5–5.1)
Sodium: 140 mEq/L (ref 135–145)
Total Bilirubin: 0.6 mg/dL (ref 0.2–1.2)
Total Protein: 6.6 g/dL (ref 6.0–8.3)

## 2021-03-21 LAB — LIPID PANEL
Cholesterol: 141 mg/dL (ref 0–200)
HDL: 29.7 mg/dL — ABNORMAL LOW (ref >39.00)
LDL Cholesterol: 96 mg/dL (ref 0–99)
NonHDL: 110.8
Total CHOL/HDL Ratio: 5
Triglycerides: 76 mg/dL (ref 0.0–149.0)
VLDL: 15.2 mg/dL (ref 0.0–40.0)

## 2021-03-21 LAB — VITAMIN D 25 HYDROXY (VIT D DEFICIENCY, FRACTURES): VITD: 34.08 ng/mL (ref 30.00–100.00)

## 2021-03-21 LAB — HEMOGLOBIN A1C: Hgb A1c MFr Bld: 5.6 % (ref 4.6–6.5)

## 2021-03-21 LAB — TSH: TSH: 3.71 u[IU]/mL (ref 0.35–4.50)

## 2021-03-21 MED ORDER — HYDROCHLOROTHIAZIDE 25 MG PO TABS: 25.0000 mg | ORAL_TABLET | Freq: Every day | ORAL | 3 refills | Status: DC

## 2021-03-21 NOTE — Progress Notes (Signed)
Established Patient Office Visit  Subjective:  Patient ID: Danny Garza, male    DOB: 18-Jun-1971  Age: 50 y.o. MRN: [id number]  CC:  Chief Complaint  Patient presents with  . Annual Exam    Patient states he is here for CPE and medication refill.    HPI Danny Garza presents for CPE  No acute concerns  Histories reviewed and updated with patient.   Hx of htn. Well controlled with HCTZ 25mg  PO qd. Good effect and compliance. No AEs.  Notes that he has started on PrEP. Tolerating well. Managed through outside clinic. Has received routine STD screen through that office as well as renal function testing.  Also notes some ongoing L side plantar fasciitis. Has had massage which has helped. Also rolls out foot frequently. New shoes at appropriate intervals. No change in symptoms, reports they wax and wane.   Seen by derm since last seen, MOHS procedure on L side of forehead. No sequelae.   Past Medical History:  Diagnosis Date  . Allergic rhinitis   . Allergy   . HTN (hypertension)     No past surgical history on file.  Family History  Problem Relation Age of Onset  . Lung cancer Father        throat secondary to tobacco use  . Hypertension Maternal Grandmother   . Heart disease Mother        CABG in 53's    Social History   Socioeconomic History  . Marital status: Single    Spouse name: Not on file  . Number of children: Not on file  . Years of education: Not on file  . Highest education level: Not on file  Occupational History  . Occupation: chaplain/professor  Tobacco Use  . Smoking status: Never Smoker  . Smokeless tobacco: Never Used  Vaping Use  . Vaping Use: Never used  Substance and Sexual Activity  . Alcohol use: Yes    Alcohol/week: 2.0 standard drinks    Types: 2 Standard drinks or equivalent per week    Comment: BEER  . Drug use: No  . Sexual activity: Not Currently  Other Topics Concern  . Not on file  Social History Narrative   Exercise  cardio and weights 4 times a week   Social Determinants of Health   Financial Resource Strain: Not on file  Food Insecurity: Not on file  Transportation Needs: Not on file  Physical Activity: Not on file  Stress: Not on file  Social Connections: Not on file  Intimate Partner Violence: Not on file    Outpatient Medications Prior to Visit  Medication Sig Dispense Refill  . ferrous sulfate 325 (65 FE) MG tablet Take 1 tablet (325 mg total) by mouth 2 (two) times daily with a meal. 180 tablet 3  . hydrochlorothiazide (HYDRODIURIL) 25 MG tablet Take 1 tablet (25 mg total) by mouth daily. 90 tablet 3  . Vitamin D, Ergocalciferol, (DRISDOL) 1.25 MG (50000 UNIT) CAPS capsule Take 1 capsule (50,000 Units total) by mouth every 7 (seven) days. (Patient not taking: Reported on 03/21/2021) 12 capsule 0   No facility-administered medications prior to visit.    No Known Allergies  ROS Review of Systems  Constitutional: Negative.   HENT: Negative.   Eyes: Negative.   Respiratory: Negative.   Cardiovascular: Negative.   Gastrointestinal: Negative.   Genitourinary: Negative.   Musculoskeletal: Negative.   Skin: Negative.   Neurological: Negative.   Psychiatric/Behavioral: Negative.   All other systems reviewed  and are negative.     Objective:    Physical Exam Vitals and nursing note reviewed.  Constitutional:      General: He is not in acute distress.    Appearance: Normal appearance. He is normal weight. He is not ill-appearing, toxic-appearing or diaphoretic.  HENT:     Head: Normocephalic and atraumatic.     Right Ear: Tympanic membrane, ear canal and external ear normal. There is no impacted cerumen.     Left Ear: Tympanic membrane, ear canal and external ear normal. There is no impacted cerumen.     Nose: Nose normal. No congestion or rhinorrhea.     Mouth/Throat:     Mouth: Mucous membranes are moist.     Pharynx: Oropharynx is clear. No oropharyngeal exudate or posterior  oropharyngeal erythema.  Eyes:     General: No scleral icterus.       Right eye: No discharge.        Left eye: No discharge.     Extraocular Movements: Extraocular movements intact.     Conjunctiva/sclera: Conjunctivae normal.     Pupils: Pupils are equal, round, and reactive to light.  Neck:     Vascular: No carotid bruit.  Cardiovascular:     Rate and Rhythm: Normal rate and regular rhythm.     Pulses: Normal pulses.     Heart sounds: Normal heart sounds. No murmur heard. No friction rub. No gallop.   Pulmonary:     Effort: Pulmonary effort is normal. No respiratory distress.     Breath sounds: Normal breath sounds. No stridor. No wheezing, rhonchi or rales.  Chest:     Chest wall: No tenderness.  Abdominal:     General: Abdomen is flat. Bowel sounds are normal. There is no distension.     Palpations: Abdomen is soft. There is no mass.     Tenderness: There is no abdominal tenderness. There is no right CVA tenderness, left CVA tenderness, guarding or rebound.     Hernia: No hernia is present.  Musculoskeletal:        General: No swelling, tenderness, deformity or signs of injury. Normal range of motion.     Cervical back: Normal range of motion and neck supple. No rigidity or tenderness.     Right lower leg: No edema.     Left lower leg: No edema.  Lymphadenopathy:     Cervical: No cervical adenopathy.  Skin:    General: Skin is warm and dry.     Capillary Refill: Capillary refill takes less than 2 seconds.     Coloration: Skin is not jaundiced or pale.     Findings: No bruising, erythema, lesion or rash.  Neurological:     General: No focal deficit present.     Mental Status: He is alert and oriented to person, place, and time. Mental status is at baseline.     Cranial Nerves: No cranial nerve deficit.     Motor: No weakness.     Gait: Gait normal.  Psychiatric:        Mood and Affect: Mood normal.        Behavior: Behavior normal.        Thought Content: Thought  content normal.        Judgment: Judgment normal.     BP 135/89   Pulse 80   Temp 98 F (36.7 C) (Temporal)   Resp 18   Ht 5\' 11"  (1.803 m)   Wt 197 lb (89.4 kg)  SpO2 99%   BMI 27.48 kg/m  Wt Readings from Last 3 Encounters:  03/21/21 197 lb (89.4 kg)  12/06/20 201 lb 3.2 oz (91.3 kg)  03/18/20 190 lb 12.8 oz (86.5 kg)     There are no preventive care reminders to display for this patient.  There are no preventive care reminders to display for this patient.  Lab Results  Component Value Date   TSH 2.490 12/06/2020   Lab Results  Component Value Date   WBC 7.4 12/06/2020   HGB 17.9 (H) 12/06/2020   HCT 53.2 (H) 12/06/2020   MCV 90 12/06/2020   PLT 288 12/06/2020   Lab Results  Component Value Date   NA 139 12/06/2020   K 4.0 12/06/2020   CO2 20 12/06/2020   GLUCOSE 89 12/06/2020   BUN 15 12/06/2020   CREATININE 1.03 12/06/2020   BILITOT 0.5 03/15/2020   ALKPHOS 102 03/15/2020   AST 25 03/15/2020   ALT 42 03/15/2020   PROT 6.8 03/15/2020   ALBUMIN 4.5 03/15/2020   CALCIUM 9.4 12/06/2020   Lab Results  Component Value Date   CHOL 196 03/15/2020   Lab Results  Component Value Date   HDL 41 03/15/2020   Lab Results  Component Value Date   LDLCALC 133 (H) 03/15/2020   Lab Results  Component Value Date   TRIG 123 03/15/2020   Lab Results  Component Value Date   CHOLHDL 4.8 03/15/2020   Lab Results  Component Value Date   HGBA1C 5.4 03/15/2020      Assessment & Plan:   Problem List Items Addressed This Visit      Cardiovascular and Mediastinum   Essential hypertension   Relevant Medications   hydrochlorothiazide (HYDRODIURIL) 25 MG tablet      Meds ordered this encounter  Medications  . hydrochlorothiazide (HYDRODIURIL) 25 MG tablet    Sig: Take 1 tablet (25 mg total) by mouth daily.    Dispense:  90 tablet    Refill:  3    Follow-up: No follow-ups on file.   PLAN  Exam unremarkable  Labs collected. Will follow up with  the patient as warranted.  Return in 1 year for CPE and labs, sooner with concerns.  Patient encouraged to call clinic with any questions, comments, or concerns.  Janeece Agee, NP

## 2021-03-21 NOTE — Patient Instructions (Signed)
Mr. Standre -   As always, great to see you. Your health continues to be stellar overall.   Labs will be back in a day or two. I'll contact you with any concerns.  Let's plan on annual physicals with labs unless bloodwork today indicates return sooner.   Give me a buzz if you need anything, otherwise, stay well!  Rich

## 2021-03-22 LAB — IRON,TIBC AND FERRITIN PANEL
%SAT: 28 % (calc) (ref 20–48)
Ferritin: 16 ng/mL — ABNORMAL LOW (ref 38–380)
Iron: 115 ug/dL (ref 50–180)
TIBC: 410 mcg/dL (calc) (ref 250–425)

## 2021-04-18 ENCOUNTER — Other Ambulatory Visit: Payer: Self-pay | Admitting: Registered Nurse

## 2021-04-18 DIAGNOSIS — E559 Vitamin D deficiency, unspecified: Secondary | ICD-10-CM

## 2021-05-22 ENCOUNTER — Other Ambulatory Visit: Payer: Self-pay | Admitting: Registered Nurse

## 2021-05-22 DIAGNOSIS — I10 Essential (primary) hypertension: Secondary | ICD-10-CM

## 2021-09-21 ENCOUNTER — Other Ambulatory Visit: Payer: Self-pay

## 2021-09-21 ENCOUNTER — Ambulatory Visit
Admission: EM | Admit: 2021-09-21 | Discharge: 2021-09-21 | Disposition: A | Discharge status: Home / Self Care | Payer: 59 | Attending: Physician Assistant | Admitting: Physician Assistant

## 2021-09-21 DIAGNOSIS — J329 Chronic sinusitis, unspecified: Secondary | ICD-10-CM

## 2021-09-21 DIAGNOSIS — J40 Bronchitis, not specified as acute or chronic: Secondary | ICD-10-CM | POA: Diagnosis not present

## 2021-09-21 MED ORDER — AMOXICILLIN-POT CLAVULANATE 875-125 MG PO TABS: 1.0000 | ORAL_TABLET | Freq: Two times a day (BID) | ORAL | 0 refills | Status: DC

## 2021-09-21 MED ORDER — PREDNISONE 20 MG PO TABS: 40.0000 mg | ORAL_TABLET | Freq: Every day | ORAL | 0 refills | Status: AC

## 2021-09-21 NOTE — ED Provider Notes (Signed)
[facility]    CSN: [id number] Arrival date & time: 09/21/21  1622      History   Chief Complaint Chief Complaint  Patient presents with   Cough    HPI Danny Garza is a 50 y.o. male.   Patient here today for evaluation of nasal congestion, sinus pressure, and cough that she has had the last 2 weeks. OTC meds are not helping with symptoms. He has not had any fever.   The history is provided by the patient.  Cough Associated symptoms: no chills, no ear pain, no eye discharge, no fever, no shortness of breath and no sore throat    Past Medical History:  Diagnosis Date   Allergic rhinitis    Allergy    HTN (hypertension)     Patient Active Problem List   Diagnosis Date Noted   On pre-exposure prophylaxis for HIV 03/21/2021   History of anemia 03/21/2021   Essential hypertension 03/18/2020   Anemia 05/01/2018    History reviewed. No pertinent surgical history.     Home Medications    Prior to Admission medications   Medication Sig Start Date End Date Taking? Authorizing Provider  amoxicillin-clavulanate (AUGMENTIN) 875-125 MG tablet Take 1 tablet by mouth every 12 (twelve) hours. 09/21/21  Yes Tomi Bamberger, PA-C  predniSONE (DELTASONE) 20 MG tablet Take 2 tablets (40 mg total) by mouth daily with breakfast for 5 days. 09/21/21 09/26/21 Yes Tomi Bamberger, PA-C  ferrous sulfate 325 (65 FE) MG tablet Take 1 tablet (325 mg total) by mouth 2 (two) times daily with a meal. 03/18/20   Janeece Agee, NP  hydrochlorothiazide (HYDRODIURIL) 25 MG tablet Take 1 tablet (25 mg total) by mouth daily. 03/21/21   Janeece Agee, NP  Vitamin D, Ergocalciferol, (DRISDOL) 1.25 MG (50000 UNIT) CAPS capsule TAKE 1 CAPSULE BY MOUTH EVERY 7 DAYS 04/18/21   Janeece Agee, NP    Family History Family History  Problem Relation Age of Onset   Lung cancer Father        throat secondary to tobacco use   Hypertension Maternal Grandmother    Heart disease Mother         CABG in 45's    Social History Social History   Tobacco Use   Smoking status: Never   Smokeless tobacco: Never  Vaping Use   Vaping Use: Never used  Substance Use Topics   Alcohol use: Yes    Alcohol/week: 2.0 standard drinks    Types: 2 Standard drinks or equivalent per week    Comment: BEER   Drug use: No     Allergies   Patient has no known allergies.   Review of Systems Review of Systems  Constitutional:  Negative for chills and fever.  HENT:  Positive for congestion and sinus pressure. Negative for ear pain and sore throat.   Eyes:  Negative for discharge and redness.  Respiratory:  Positive for cough. Negative for shortness of breath.   Gastrointestinal:  Negative for abdominal pain, nausea and vomiting.    Physical Exam Triage Vital Signs ED Triage Vitals  Enc Vitals Group     BP 09/21/21 1836 (!) 163/109     Pulse Rate 09/21/21 1836 79     Resp 09/21/21 1836 18     Temp 09/21/21 1836 97.9 F (36.6 C)     Temp Source 09/21/21 1836 Oral     SpO2 09/21/21 1836 97 %     Weight --  Height --      Head Circumference --      Peak Flow --      Pain Score 09/21/21 1837 0     Pain Loc --      Pain Edu? --      Excl. in GC? --    No data found.  Updated Vital Signs BP (!) 163/109 (BP Location: Left Arm)   Pulse 79   Temp 97.9 F (36.6 C) (Oral)   Resp 18   SpO2 97%       Physical Exam Vitals and nursing note reviewed.  Constitutional:      General: He is not in acute distress.    Appearance: He is well-developed. He is not ill-appearing.  HENT:     Head: Normocephalic and atraumatic.     Nose: Congestion present.  Eyes:     Conjunctiva/sclera: Conjunctivae normal.  Cardiovascular:     Rate and Rhythm: Normal rate and regular rhythm.     Heart sounds: Normal heart sounds. No murmur heard. Pulmonary:     Effort: Pulmonary effort is normal. No respiratory distress.     Breath sounds: Normal breath sounds. No wheezing, rhonchi or rales.   Skin:    General: Skin is warm and dry.  Neurological:     Mental Status: He is alert.  Psychiatric:        Mood and Affect: Mood normal.        Behavior: Behavior normal.     UC Treatments / Results  Labs (all labs ordered are listed, but only abnormal results are displayed) Labs Reviewed - No data to display  EKG   Radiology No results found.  Procedures Procedures (including critical care time)  Medications Ordered in UC Medications - No data to display  Initial Impression / Assessment and Plan / UC Course  I have reviewed the triage vital signs and the nursing notes.  Pertinent labs & imaging results that were available during my care of the patient were reviewed by me and considered in my medical decision making (see chart for details).   Suspect sinusitis/ bronchitis- will treat with antibiotic and steroids. Recommend follow up if symptoms fail to improve or with any further concerns.   Final Clinical Impressions(s) / UC Diagnoses   Final diagnoses:  Sinobronchitis   Discharge Instructions   None    ED Prescriptions     Medication Sig Dispense Auth. Provider   amoxicillin-clavulanate (AUGMENTIN) 875-125 MG tablet Take 1 tablet by mouth every 12 (twelve) hours. 14 tablet Erma Pinto F, PA-C   predniSONE (DELTASONE) 20 MG tablet Take 2 tablets (40 mg total) by mouth daily with breakfast for 5 days. 10 tablet Tomi Bamberger, PA-C      PDMP not reviewed this encounter.   Tomi Bamberger, PA-C 09/21/21 1919

## 2021-09-21 NOTE — ED Triage Notes (Signed)
Pt c/o cough and congestion x2wk with no relief using OTC meds.

## 2022-03-13 ENCOUNTER — Encounter: Payer: Self-pay | Admitting: Registered Nurse

## 2022-03-23 ENCOUNTER — Encounter: Payer: 59 | Admitting: Registered Nurse

## 2022-03-27 ENCOUNTER — Encounter: Payer: Self-pay | Admitting: Registered Nurse

## 2022-03-27 ENCOUNTER — Ambulatory Visit (INDEPENDENT_AMBULATORY_CARE_PROVIDER_SITE_OTHER): Payer: 59 | Admitting: Registered Nurse

## 2022-03-27 VITALS — BP 136/82 | HR 78 | Temp 98.2°F | Resp 16 | Ht 70.0 in | Wt 190.6 lb

## 2022-03-27 DIAGNOSIS — Z13228 Encounter for screening for other metabolic disorders: Secondary | ICD-10-CM | POA: Diagnosis not present

## 2022-03-27 DIAGNOSIS — E559 Vitamin D deficiency, unspecified: Secondary | ICD-10-CM

## 2022-03-27 DIAGNOSIS — Z130 Encounter for screening for diseases of the blood and blood-forming organs and certain disorders involving the immune mechanism: Secondary | ICD-10-CM

## 2022-03-27 DIAGNOSIS — Z0000 Encounter for general adult medical examination without abnormal findings: Secondary | ICD-10-CM | POA: Diagnosis not present

## 2022-03-27 DIAGNOSIS — Z13220 Encounter for screening for lipoid disorders: Secondary | ICD-10-CM | POA: Diagnosis not present

## 2022-03-27 DIAGNOSIS — Z125 Encounter for screening for malignant neoplasm of prostate: Secondary | ICD-10-CM

## 2022-03-27 DIAGNOSIS — Z1159 Encounter for screening for other viral diseases: Secondary | ICD-10-CM

## 2022-03-27 DIAGNOSIS — Z1329 Encounter for screening for other suspected endocrine disorder: Secondary | ICD-10-CM

## 2022-03-27 LAB — LIPID PANEL
Cholesterol: 166 mg/dL (ref 0–200)
HDL: 31.6 mg/dL — ABNORMAL LOW (ref >39.00)
LDL Cholesterol: 111 mg/dL — ABNORMAL HIGH (ref 0–99)
NonHDL: 134.19
Total CHOL/HDL Ratio: 5
Triglycerides: 117 mg/dL (ref 0.0–149.0)
VLDL: 23.4 mg/dL (ref 0.0–40.0)

## 2022-03-27 LAB — CBC WITH DIFFERENTIAL/PLATELET
Basophils Absolute: 0 10*3/uL (ref 0.0–0.1)
Basophils Relative: 0.7 % (ref 0.0–3.0)
Eosinophils Absolute: 0.2 10*3/uL (ref 0.0–0.7)
Eosinophils Relative: 2.4 % (ref 0.0–5.0)
HCT: 50 % (ref 39.0–52.0)
Hemoglobin: 17 g/dL (ref 13.0–17.0)
Lymphocytes Relative: 41.3 % (ref 12.0–46.0)
Lymphs Abs: 2.8 10*3/uL (ref 0.7–4.0)
MCHC: 33.9 g/dL (ref 30.0–36.0)
MCV: 89.7 fl (ref 78.0–100.0)
Monocytes Absolute: 0.5 10*3/uL (ref 0.1–1.0)
Monocytes Relative: 7.5 % (ref 3.0–12.0)
Neutro Abs: 3.3 10*3/uL (ref 1.4–7.7)
Neutrophils Relative %: 48.1 % (ref 43.0–77.0)
Platelets: 317 10*3/uL (ref 150.0–400.0)
RBC: 5.57 Mil/uL (ref 4.22–5.81)
RDW: 13.4 % (ref 11.5–15.5)
WBC: 6.9 10*3/uL (ref 4.0–10.5)

## 2022-03-27 LAB — COMPREHENSIVE METABOLIC PANEL
ALT: 45 U/L (ref 0–53)
AST: 27 U/L (ref 0–37)
Albumin: 4.5 g/dL (ref 3.5–5.2)
Alkaline Phosphatase: 117 U/L (ref 39–117)
BUN: 15 mg/dL (ref 6–23)
CO2: 30 mEq/L (ref 19–32)
Calcium: 9.4 mg/dL (ref 8.4–10.5)
Chloride: 100 mEq/L (ref 96–112)
Creatinine, Ser: 1.04 mg/dL (ref 0.40–1.50)
GFR: 83.83 mL/min (ref >60.00)
Glucose, Bld: 88 mg/dL (ref 70–99)
Potassium: 4.2 mEq/L (ref 3.5–5.1)
Sodium: 139 mEq/L (ref 135–145)
Total Bilirubin: 0.6 mg/dL (ref 0.2–1.2)
Total Protein: 7 g/dL (ref 6.0–8.3)

## 2022-03-27 LAB — PSA: PSA: 0.7 ng/mL (ref 0.10–4.00)

## 2022-03-27 LAB — HEMOGLOBIN A1C: Hgb A1c MFr Bld: 5.7 % (ref 4.6–6.5)

## 2022-03-27 LAB — TSH: TSH: 1.91 u[IU]/mL (ref 0.35–5.50)

## 2022-03-27 LAB — VITAMIN D 25 HYDROXY (VIT D DEFICIENCY, FRACTURES): VITD: 40.39 ng/mL (ref 30.00–100.00)

## 2022-03-27 NOTE — Progress Notes (Signed)
Complete physical exam  Patient: Danny Garza   DOB: 1971/06/02   51 y.o. Male  MRN: [id number] Visit Date: 03/27/2022  Subjective:    Chief Complaint  Patient presents with   Annual Exam    Pt has no concerns is just here for physical    Danny Garza is a 51 y.o. male who presents today for a complete physical exam. He reports consuming a general diet. Home exercise routine includes  . He generally feels well. He reports sleeping well. He does not have additional problems to discuss today.   Vision:Within the last year Dental:Within Last 6 months STD Screen:No PSA:Yes Most recent fall risk assessment:    03/27/2022   11:36 AM  Fall Risk   Falls in the past year? 0  Number falls in past yr: 0  Injury with Fall? 0  Risk for fall due to : No Fall Risks  Follow up Falls evaluation completed     Most recent depression screenings:    03/27/2022   11:36 AM 03/21/2021    8:20 AM  PHQ 2/9 Scores  PHQ - 2 Score 0 0  PHQ- 9 Score 0      Patient Active Problem List   Diagnosis Date Noted   On pre-exposure prophylaxis for HIV 03/21/2021   History of anemia 03/21/2021   Essential hypertension 03/18/2020   Anemia 05/01/2018   Past Medical History:  Diagnosis Date   Allergic rhinitis    Allergy    HTN (hypertension)    History reviewed. No pertinent surgical history. Social History   Tobacco Use   Smoking status: Never   Smokeless tobacco: Never  Vaping Use   Vaping Use: Never used  Substance Use Topics   Alcohol use: Yes    Alcohol/week: 2.0 standard drinks    Types: 2 Standard drinks or equivalent per week    Comment: BEER   Drug use: No   Social History   Socioeconomic History   Marital status: Single    Spouse name: Not on file   Number of children: Not on file   Years of education: Not on file   Highest education level: Not on file  Occupational History   Occupation: chaplain/professor  Tobacco Use   Smoking status: Never   Smokeless tobacco: Never   Vaping Use   Vaping Use: Never used  Substance and Sexual Activity   Alcohol use: Yes    Alcohol/week: 2.0 standard drinks    Types: 2 Standard drinks or equivalent per week    Comment: BEER   Drug use: No   Sexual activity: Not Currently  Other Topics Concern   Not on file  Social History Narrative   Exercise cardio and weights 4 times a week   Social Determinants of Health   Financial Resource Strain: Not on file  Food Insecurity: Not on file  Transportation Needs: Not on file  Physical Activity: Not on file  Stress: Not on file  Social Connections: Not on file  Intimate Partner Violence: Not on file   Family Status  Relation Name Status   Father  Deceased   MGM  Deceased   Mother  Alive   Sister  Alive   Brother  Alive   MGF  Deceased   PGM  Deceased   PGF  Deceased   Sister  Alive   Family History  Problem Relation Age of Onset   Lung cancer Father        throat secondary to tobacco  use   Hypertension Maternal Grandmother    Heart disease Mother        CABG in 53's   No Known Allergies   Patient Care Team: Janeece Agee, NP as PCP - General (Adult Health Nurse Practitioner)   Medications: Outpatient Medications Prior to Visit  Medication Sig   emtricitabine-tenofovir (TRUVADA) 200-300 MG tablet Take 1 tablet by mouth daily.   hydrochlorothiazide (HYDRODIURIL) 25 MG tablet Take 1 tablet (25 mg total) by mouth daily.   amoxicillin-clavulanate (AUGMENTIN) 875-125 MG tablet Take 1 tablet by mouth every 12 (twelve) hours. (Patient not taking: Reported on 03/27/2022)   ferrous sulfate 325 (65 FE) MG tablet Take 1 tablet (325 mg total) by mouth 2 (two) times daily with a meal. (Patient not taking: Reported on 03/27/2022)   Vitamin D, Ergocalciferol, (DRISDOL) 1.25 MG (50000 UNIT) CAPS capsule TAKE 1 CAPSULE BY MOUTH EVERY 7 DAYS (Patient not taking: Reported on 03/27/2022)   No facility-administered medications prior to visit.    Review of Systems   Constitutional: Negative.   HENT: Negative.    Eyes: Negative.   Respiratory: Negative.    Cardiovascular: Negative.   Gastrointestinal: Negative.   Endocrine: Negative.   Genitourinary: Negative.   Musculoskeletal: Negative.   Skin: Negative.   Allergic/Immunologic: Negative.   Neurological: Negative.   Hematological: Negative.   Psychiatric/Behavioral: Negative.    All other systems reviewed and are negative.  Last CBC Lab Results  Component Value Date   WBC 6.5 03/21/2021   HGB 16.3 03/21/2021   HCT 47.6 03/21/2021   MCV 89.7 03/21/2021   MCH 30.2 12/06/2020   RDW 13.6 03/21/2021   PLT 272.0 03/21/2021   Last metabolic panel Lab Results  Component Value Date   GLUCOSE 104 (H) 03/21/2021   NA 140 03/21/2021   K 3.7 03/21/2021   CL 103 03/21/2021   CO2 28 03/21/2021   BUN 14 03/21/2021   CREATININE 1.11 03/21/2021   GFRNONAA 85 12/06/2020   CALCIUM 9.0 03/21/2021   PROT 6.6 03/21/2021   ALBUMIN 4.4 03/21/2021   LABGLOB 2.3 03/15/2020   AGRATIO 2.0 03/15/2020   BILITOT 0.6 03/21/2021   ALKPHOS 84 03/21/2021   AST 23 03/21/2021   ALT 33 03/21/2021   Last lipids Lab Results  Component Value Date   CHOL 141 03/21/2021   HDL 29.70 (L) 03/21/2021   LDLCALC 96 03/21/2021   TRIG 76.0 03/21/2021   CHOLHDL 5 03/21/2021   Last hemoglobin A1c Lab Results  Component Value Date   HGBA1C 5.6 03/21/2021   Last thyroid functions Lab Results  Component Value Date   TSH 3.71 03/21/2021   Last vitamin D Lab Results  Component Value Date   VD25OH 34.08 03/21/2021   Last vitamin B12 and Folate Lab Results  Component Value Date   VITAMINB12 377 12/06/2020        Objective:     BP 136/82   Pulse 78   Temp 98.2 F (36.8 C) (Temporal)   Resp 16   Ht 5\' 10"  (1.778 m)   Wt 190 lb 9.6 oz (86.5 kg)   SpO2 96%   BMI 27.35 kg/m   BP Readings from Last 3 Encounters:  03/27/22 136/82  09/21/21 (!) 163/109  03/21/21 135/89   Wt Readings from Last 3  Encounters:  03/27/22 190 lb 9.6 oz (86.5 kg)  03/21/21 197 lb (89.4 kg)  12/06/20 201 lb 3.2 oz (91.3 kg)   SpO2 Readings from Last 3 Encounters:  03/27/22  96%  09/21/21 97%  03/21/21 99%      Physical Exam Vitals and nursing note reviewed.  Constitutional:      General: He is not in acute distress.    Appearance: Normal appearance. He is normal weight. He is not ill-appearing, toxic-appearing or diaphoretic.  HENT:     Head: Normocephalic and atraumatic.     Right Ear: Tympanic membrane, ear canal and external ear normal. There is no impacted cerumen.     Left Ear: Tympanic membrane, ear canal and external ear normal. There is no impacted cerumen.     Nose: Nose normal. No congestion or rhinorrhea.     Mouth/Throat:     Mouth: Mucous membranes are moist.     Pharynx: Oropharynx is clear. No oropharyngeal exudate or posterior oropharyngeal erythema.  Eyes:     General: No scleral icterus.       Right eye: No discharge.        Left eye: No discharge.     Extraocular Movements: Extraocular movements intact.     Conjunctiva/sclera: Conjunctivae normal.     Pupils: Pupils are equal, round, and reactive to light.  Neck:     Vascular: No carotid bruit.  Cardiovascular:     Rate and Rhythm: Normal rate and regular rhythm.     Pulses: Normal pulses.     Heart sounds: Normal heart sounds. No murmur heard.   No friction rub. No gallop.  Pulmonary:     Effort: Pulmonary effort is normal. No respiratory distress.     Breath sounds: Normal breath sounds. No stridor. No wheezing, rhonchi or rales.  Chest:     Chest wall: No tenderness.  Abdominal:     General: Abdomen is flat. Bowel sounds are normal. There is no distension.     Palpations: Abdomen is soft. There is no mass.     Tenderness: There is no abdominal tenderness. There is no right CVA tenderness, left CVA tenderness, guarding or rebound.     Hernia: No hernia is present.  Musculoskeletal:        General: No swelling,  tenderness, deformity or signs of injury. Normal range of motion.     Cervical back: Normal range of motion and neck supple. No rigidity or tenderness.     Right lower leg: No edema.     Left lower leg: No edema.  Lymphadenopathy:     Cervical: No cervical adenopathy.  Skin:    General: Skin is warm and dry.     Capillary Refill: Capillary refill takes less than 2 seconds.     Coloration: Skin is not jaundiced or pale.     Findings: No bruising, erythema, lesion or rash.  Neurological:     General: No focal deficit present.     Mental Status: He is alert and oriented to person, place, and time. Mental status is at baseline.     Cranial Nerves: No cranial nerve deficit.     Sensory: No sensory deficit.     Motor: No weakness.     Coordination: Coordination normal.     Gait: Gait normal.     Deep Tendon Reflexes: Reflexes normal.  Psychiatric:        Mood and Affect: Mood normal.        Behavior: Behavior normal.        Thought Content: Thought content normal.        Judgment: Judgment normal.     No results found for any visits on  03/27/22.    Assessment & Plan:    Routine Health Maintenance and Physical Exam  Immunization History  Administered Date(s) Administered   Tdap 09/01/2015    Health Maintenance  Topic Date Due   COVID-19 Vaccine (1) 04/12/2022 (Originally 05/30/1971)   Zoster Vaccines- Shingrix (1 of 2) 06/27/2022 (Originally 11/30/1990)   Hepatitis C Screening  03/28/2023 (Originally 11/30/1989)   INFLUENZA VACCINE  05/22/2022   TETANUS/TDAP  08/31/2025   COLONOSCOPY (Pts [id number] Insurance coverage will need to be confirmed)  06/17/2028   HIV Screening  Completed   HPV VACCINES  Aged Out    Discussed health benefits of physical activity, and encouraged him to engage in regular exercise appropriate for his age and condition.  Problem List Items Addressed This Visit   None Visit Diagnoses     Annual physical exam    -  Primary   Lipid screening        Relevant Orders   Lipid panel   Screening for endocrine, metabolic and immunity disorder       Relevant Orders   Comprehensive metabolic panel   Hemoglobin A1c   CBC with Differential/Platelet   TSH   Screening PSA (prostate specific antigen)       Relevant Orders   PSA   Encounter for screening for other viral diseases       Relevant Orders   Hepatitis C Antibody      Return in about 1 year (around 03/28/2023) for CPE and labs.     PLAN Exam unremarkable Labs collected. Will follow up with the patient as warranted. Patient encouraged to call clinic with any questions, comments, or concerns.   Janeece Ageeichard Caasi Giglia, NP

## 2022-03-27 NOTE — Patient Instructions (Signed)
[name] -   Randie Heinz to see you  Call with concerns  I'll let you know if labs are worrisome  See you in a year for a physical and labs, sooner if you need anything  Thanks,  Luan Pulling

## 2022-03-28 LAB — HEPATITIS C ANTIBODY
Hepatitis C Ab: NONREACTIVE
SIGNAL TO CUT-OFF: 0.14 (ref <1.00)

## 2022-06-04 ENCOUNTER — Other Ambulatory Visit: Payer: Self-pay | Admitting: Lab

## 2022-06-04 ENCOUNTER — Telehealth: Payer: Self-pay | Admitting: Lab

## 2022-06-04 DIAGNOSIS — I10 Essential (primary) hypertension: Secondary | ICD-10-CM

## 2022-06-05 MED ORDER — HYDROCHLOROTHIAZIDE 25 MG PO TABS: 25.0000 mg | ORAL_TABLET | Freq: Every day | ORAL | 3 refills | Status: DC

## 2022-06-05 NOTE — Telephone Encounter (Signed)
Error

## 2022-12-25 ENCOUNTER — Encounter: Payer: Self-pay | Admitting: Family

## 2022-12-25 NOTE — Patient Instructions (Signed)
Welcome to [facility], It was a pleasure meeting you today!    As discussed, I have sent your refills to your pharmacy.  I have sent a referral to ***  Please schedule a *** month follow up visit today.    PLEASE NOTE: If you had any LAB tests please let us know if you have not heard back within a few days. You may see your results on MyChart before we have a chance to review them but we will give you a call once they are reviewed by [place]. If we ordered any REFERRALS today, please let us know if you have not heard from their office within the next week.  Let us know through MyChart if you are needing REFILLS, or have your pharmacy send [place] the request. You can also use MyChart to communicate with me or any office staff.  Please try these tips to maintain a healthy lifestyle: It is important that you exercise regularly at least 30 minutes 5 times a week. Think about what you will eat, plan ahead. Choose whole foods, & think  "clean, green, fresh or frozen" over canned, processed or packaged foods which are more sugary, salty, and fatty. 70 to 75% of food eaten should be fresh vegetables and protein. 2-3  meals daily with healthy snacks between meals, but must be whole fruit, protein or vegetables. Aim to eat over a 10 hour period when you are active, for example, 7am to 5pm, and then STOP after your last meal of the day, drinking only water.  Shorter eating windows, 6-8 hours, are showing benefits in heart disease and blood sugar regulation. Drink water every day! Shoot for 64 ounces daily = 8 cups, no other drink is as healthy! Fruit juice is best enjoyed in a healthy way, by EATING the fruit.

## 2022-12-25 NOTE — Progress Notes (Unsigned)
   Patient ID: Danny Garza, male    DOB: [birth year], 52 y.o.   MRN: [id number]  No chief complaint on file.   HPI: Hypertension: Patient is currently maintained on the following medications for blood pressure: HCTZ Patient reports good compliance with blood pressure medications. Patient denies chest pain, headaches, shortness of breath or swelling. Last 3 blood pressure readings in our office are as follows: BP Readings from Last 3 Encounters:  03/27/22 136/82  09/21/21 (!) 163/109  03/21/21 135/89    Assessment & Plan:  Essential hypertension    Subjective:    Outpatient Medications Prior to Visit  Medication Sig Dispense Refill   emtricitabine-tenofovir (TRUVADA) 200-300 MG tablet Take 1 tablet by mouth daily.     hydrochlorothiazide (HYDRODIURIL) 25 MG tablet Take 1 tablet (25 mg total) by mouth daily. 90 tablet 3   No facility-administered medications prior to visit.   Past Medical History:  Diagnosis Date   Allergic rhinitis    Allergy    Anemia 05/01/2018   HTN (hypertension)    No past surgical history on file. No Known Allergies    Objective:    Physical Exam Vitals and nursing note reviewed.  Constitutional:      General: He is not in acute distress.    Appearance: Normal appearance.  HENT:     Head: Normocephalic.  Cardiovascular:     Rate and Rhythm: Normal rate and regular rhythm.  Pulmonary:     Effort: Pulmonary effort is normal.     Breath sounds: Normal breath sounds.  Musculoskeletal:        General: Normal range of motion.     Cervical back: Normal range of motion.  Skin:    General: Skin is warm and dry.  Neurological:     Mental Status: He is alert and oriented to person, place, and time.  Psychiatric:        Mood and Affect: Mood normal.    There were no vitals taken for this visit. Wt Readings from Last 3 Encounters:  03/27/22 190 lb 9.6 oz (86.5 kg)  03/21/21 197 lb (89.4 kg)  12/06/20 201 lb 3.2 oz (91.3 kg)        Jeanie Sewer, NP

## 2022-12-26 ENCOUNTER — Encounter: Payer: Self-pay | Admitting: Family

## 2022-12-26 ENCOUNTER — Ambulatory Visit (INDEPENDENT_AMBULATORY_CARE_PROVIDER_SITE_OTHER): Payer: 59 | Admitting: Family

## 2022-12-26 VITALS — BP 141/101 | HR 84 | Temp 97.8°F | Ht 70.0 in | Wt 199.1 lb

## 2022-12-26 DIAGNOSIS — R142 Eructation: Secondary | ICD-10-CM | POA: Diagnosis not present

## 2022-12-26 DIAGNOSIS — I10 Essential (primary) hypertension: Secondary | ICD-10-CM

## 2022-12-26 DIAGNOSIS — K429 Umbilical hernia without obstruction or gangrene: Secondary | ICD-10-CM | POA: Diagnosis not present

## 2022-12-26 MED ORDER — FAMOTIDINE 20 MG PO TABS: 20.0000 mg | ORAL_TABLET | Freq: Every day | ORAL | 2 refills | Status: DC

## 2022-12-26 MED ORDER — LOSARTAN POTASSIUM 50 MG PO TABS: 50.0000 mg | ORAL_TABLET | Freq: Every day | ORAL | 5 refills | Status: DC

## 2022-12-26 NOTE — Assessment & Plan Note (Signed)
New reports bloating & belching has not tried OTC meds sending generic Pepcid, advised on use & SE f/u 3 mos or prn

## 2022-12-26 NOTE — Assessment & Plan Note (Signed)
chronic HCTZ '25mg'$  qd BP high today adding Losartan '50mg'$  qd, advised on use & SE advised on wt loss, exercise, & increased water intake f/u 3 mos

## 2022-12-26 NOTE — Assessment & Plan Note (Signed)
chronic protruding, not reducible pt denies any worsening sx advised on working on core muscle strengthening will continue to monitor

## 2023-01-11 ENCOUNTER — Ambulatory Visit (INDEPENDENT_AMBULATORY_CARE_PROVIDER_SITE_OTHER): Payer: 59 | Admitting: Family

## 2023-01-11 VITALS — BP 134/87 | HR 85 | Temp 98.0°F | Ht 70.0 in | Wt 199.2 lb

## 2023-01-11 DIAGNOSIS — K219 Gastro-esophageal reflux disease without esophagitis: Secondary | ICD-10-CM | POA: Diagnosis not present

## 2023-01-11 MED ORDER — PANTOPRAZOLE SODIUM 20 MG PO TBEC: 20.0000 mg | DELAYED_RELEASE_TABLET | Freq: Every day | ORAL | 2 refills | Status: DC

## 2023-01-11 NOTE — Progress Notes (Signed)
   Patient ID: Danny Garza, male    DOB: [birth year], 52 y.o.   MRN: [id number]  Chief Complaint  Patient presents with   Gastroesophageal Reflux    sx for a few weeks   HPI: GERD:  HX:  first visit 2 weeks pt reported just belching, no heartburn, no epigastric pain, no nausea, denied any significant change in diet, felt sx mostly in the early morning. I started Pepcid qhs, and he states this has not helped his sx. Reports now he is having more epigastric pain. Still denies nausea, reports pain at random times during the day, but denies pain right after a meal.  Assessment & Plan:  Gastroesophageal reflux disease without esophagitis Assessment & Plan: last visit pt reported bloating & belching sent generic Pepcid 20mg  qhs, but states he is having worsening sx today sending Protonix 20mg  bid x 2w, then qd, advised on use & SE advised on low acid diet, FODMAP foods to avoid f/u 2-4w or prn  Orders: -     Pantoprazole Sodium; Take 1 tablet (20 mg total) by mouth daily. START with 1 pill twice a day for 1-2 weeks, then 1 pill every morning.  Dispense: 30 tablet; Refill: 2   Subjective:    Outpatient Medications Prior to Visit  Medication Sig Dispense Refill   emtricitabine-tenofovir (TRUVADA) 200-300 MG tablet Take 1 tablet by mouth daily.     famotidine (PEPCID) 20 MG tablet Take 1 tablet (20 mg total) by mouth at bedtime. 30 tablet 2   hydrochlorothiazide (HYDRODIURIL) 25 MG tablet Take 1 tablet (25 mg total) by mouth daily. 90 tablet 3   loratadine (CLARITIN) 10 MG tablet Take 10 mg by mouth daily.     losartan (COZAAR) 50 MG tablet Take 1 tablet (50 mg total) by mouth daily. 30 tablet 5   No facility-administered medications prior to visit.   Past Medical History:  Diagnosis Date   Allergic rhinitis    Allergy    Anemia 05/01/2018   HTN (hypertension)    No past surgical history on file. No Known Allergies    Objective:    Physical Exam Vitals and nursing note reviewed.   Constitutional:      General: He is not in acute distress.    Appearance: Normal appearance.  HENT:     Head: Normocephalic.  Cardiovascular:     Rate and Rhythm: Normal rate and regular rhythm.  Pulmonary:     Effort: Pulmonary effort is normal.     Breath sounds: Normal breath sounds.  Musculoskeletal:        General: Normal range of motion.     Cervical back: Normal range of motion.  Skin:    General: Skin is warm and dry.  Neurological:     Mental Status: He is alert and oriented to person, place, and time.  Psychiatric:        Mood and Affect: Mood normal.    BP 134/87 (BP Location: Left Arm, Patient Position: Sitting, Cuff Size: Large)   Pulse 85   Temp 98 F (36.7 C) (Temporal)   Ht 5\' 10"  (1.778 m)   Wt 199 lb 3.2 oz (90.4 kg)   SpO2 97%   BMI 28.58 kg/m  Wt Readings from Last 3 Encounters:  01/11/23 199 lb 3.2 oz (90.4 kg)  12/26/22 199 lb 2 oz (90.3 kg)  03/27/22 190 lb 9.6 oz (86.5 kg)       Jeanie Sewer, NP

## 2023-01-11 NOTE — Assessment & Plan Note (Signed)
New reports bloating & belching has not tried OTC meds sending generic Pepcid, advised on use & SE f/u 3 mos or prn 

## 2023-01-11 NOTE — Assessment & Plan Note (Addendum)
last visit pt reported bloating & belching sent generic Pepcid 20mg  qhs, but states he is having worsening sx today sending Protonix 20mg  bid x 2w, then qd, advised on use & SE advised on low acid diet, FODMAP foods to avoid f/u 2-4w or prn

## 2023-03-29 ENCOUNTER — Ambulatory Visit (HOSPITAL_COMMUNITY)
Admission: RE | Admit: 2023-03-29 | Discharge: 2023-03-29 | Disposition: A | Discharge status: Home / Self Care | Payer: 59 | Source: Ambulatory Visit | Attending: Emergency Medicine | Admitting: Emergency Medicine

## 2023-03-29 ENCOUNTER — Encounter (HOSPITAL_COMMUNITY): Payer: Self-pay

## 2023-03-29 DIAGNOSIS — K219 Gastro-esophageal reflux disease without esophagitis: Secondary | ICD-10-CM

## 2023-03-29 MED ORDER — ALUM & MAG HYDROXIDE-SIMETH 200-200-20 MG/5ML PO SUSP
ORAL | Status: AC
  Filled 2023-03-29: qty 30

## 2023-03-29 MED ORDER — LIDOCAINE VISCOUS HCL 2 % MT SOLN
OROMUCOSAL | Status: AC
  Filled 2023-03-29: qty 15

## 2023-03-29 MED ORDER — LIDOCAINE VISCOUS HCL 2 % MT SOLN
15.0000 mL | Freq: Once | OROMUCOSAL | Status: AC
  Administered 2023-03-29: 15 mL via OROMUCOSAL

## 2023-03-29 MED ORDER — SIMETHICONE 80 MG PO CHEW: 80.0000 mg | CHEWABLE_TABLET | Freq: Four times a day (QID) | ORAL | 0 refills | Status: DC | PRN

## 2023-03-29 MED ORDER — PANTOPRAZOLE SODIUM 40 MG PO TBEC: 40.0000 mg | DELAYED_RELEASE_TABLET | Freq: Every day | ORAL | 0 refills | Status: DC

## 2023-03-29 MED ORDER — ALUM & MAG HYDROXIDE-SIMETH 200-200-20 MG/5ML PO SUSP
30.0000 mL | Freq: Once | ORAL | Status: AC
  Administered 2023-03-29: 30 mL via ORAL

## 2023-03-29 NOTE — ED Triage Notes (Signed)
Pt reports had heartburn for about 6 weeks. Doctor prescribed medications that helped Pantoprazole was taking two then got down to taking one the got bad again.  Reports has physical on Monday but doesn't want to address it then. Reports belching a lot.

## 2023-03-29 NOTE — ED Provider Notes (Signed)
[facility]    CSN: [id number] Arrival date & time: 03/29/23  1349      History   Chief Complaint Chief Complaint  Patient presents with   Heartburn    Entered by patient    HPI Danny Garza is a 52 y.o. male.   Patient presents to clinic for ongoing heartburn and belching.  After he belches, he reports a improvement in his heartburn.  He has been seeing his primary care provider for this concern and is currently taking 1 pantoprazole daily at 20 mg, he weaned down from BID. Has tried Pepcid in the past.   His dietary modifications for acid reflux include increasing water intake and drinking decaf coffee instead of regular.  Sometimes he does take a Tums. Does not take Advil, naproxen or NSAIDs on routine basis.   He also notes that his cardio recovery it was lower for the last 6 to 7 weeks per his Apple Watch.  Occasionally wakes up with heart palpitations.  Denies any chest pain or shortness of breath.  He drinks alcohol about once a week and does not smoke.     The history is provided by the patient and medical records.  Heartburn Pertinent negatives include no abdominal pain.    Past Medical History:  Diagnosis Date   Allergic rhinitis    Allergy    Anemia 05/01/2018   HTN (hypertension)     Patient Active Problem List   Diagnosis Date Noted   Gastroesophageal reflux disease without esophagitis 01/11/2023   Belching 12/26/2022   Umbilical hernia without obstruction and without gangrene 12/26/2022   On pre-exposure prophylaxis for HIV 03/21/2021   History of anemia 03/21/2021   Essential hypertension 03/18/2020    History reviewed. No pertinent surgical history.     Home Medications    Prior to Admission medications   Medication Sig Start Date End Date Taking? Authorizing Provider  pantoprazole (PROTONIX) 40 MG tablet Take 1 tablet (40 mg total) by mouth daily. 03/29/23 04/28/23 Yes Rinaldo Ratel, [place] N, FNP  simethicone (MYLICON) 80 MG chewable  tablet Chew 1 tablet (80 mg total) by mouth every 6 (six) hours as needed for flatulence. 03/29/23  Yes Rinaldo Ratel, [place] N, FNP  emtricitabine-tenofovir (TRUVADA) 200-300 MG tablet Take 1 tablet by mouth daily. 03/26/22   [provider]  famotidine (PEPCID) 20 MG tablet Take 1 tablet (20 mg total) by mouth at bedtime. 12/26/22   Dulce Sellar, NP  hydrochlorothiazide (HYDRODIURIL) 25 MG tablet Take 1 tablet (25 mg total) by mouth daily. 06/05/22   Shade Flood, MD  loratadine (CLARITIN) 10 MG tablet Take 10 mg by mouth daily.    [provider]  losartan (COZAAR) 50 MG tablet Take 1 tablet (50 mg total) by mouth daily. 12/26/22   Dulce Sellar, NP    Family History Family History  Problem Relation Age of Onset   Lung cancer Father        throat secondary to tobacco use   Hypertension Maternal Grandmother    Heart disease Mother        CABG in 83's    Social History Social History   Tobacco Use   Smoking status: Never   Smokeless tobacco: Never  Vaping Use   Vaping Use: Never used  Substance Use Topics   Alcohol use: Yes    Alcohol/week: 2.0 standard drinks of alcohol    Types: 2 Standard drinks or equivalent per week    Comment: BEER   Drug  use: No     Allergies   Patient has no known allergies.   Review of Systems Review of Systems  Constitutional:  Negative for fever.  Cardiovascular:  Positive for palpitations.  Gastrointestinal:  Positive for heartburn. Negative for abdominal pain, diarrhea, nausea and vomiting.     Physical Exam Triage Vital Signs ED Triage Vitals  Enc Vitals Group     BP 03/29/23 1413 (!) 137/94     Pulse Rate 03/29/23 1413 92     Resp 03/29/23 1413 15     Temp 03/29/23 1413 98.7 F (37.1 C)     Temp Source 03/29/23 1413 Oral     SpO2 03/29/23 1413 95 %     Weight --      Height --      Head Circumference --      Peak Flow --      Pain Score 03/29/23 1411 5     Pain Loc --      Pain Edu? --      Excl. in  GC? --    No data found.  Updated Vital Signs BP (!) 137/94 (BP Location: Right Arm)   Pulse 92   Temp 98.7 F (37.1 C) (Oral)   Resp 15   SpO2 95%   Visual Acuity Right Eye Distance:   Left Eye Distance:   Bilateral Distance:    Right Eye Near:   Left Eye Near:    Bilateral Near:     Physical Exam Vitals and nursing note reviewed.  Constitutional:      Appearance: Normal appearance.  HENT:     Head: Normocephalic and atraumatic.     Right Ear: External ear normal.     Left Ear: External ear normal.     Nose: Nose normal.     Mouth/Throat:     Mouth: Mucous membranes are moist.  Eyes:     Conjunctiva/sclera: Conjunctivae normal.  Cardiovascular:     Rate and Rhythm: Normal rate and regular rhythm.     Heart sounds: Normal heart sounds. No murmur heard. Pulmonary:     Effort: Pulmonary effort is normal. No respiratory distress.     Breath sounds: Normal breath sounds.  Abdominal:     General: Abdomen is flat.  Musculoskeletal:        General: No swelling. Normal range of motion.  Skin:    General: Skin is warm and dry.  Neurological:     General: No focal deficit present.     Mental Status: He is alert and oriented to person, place, and time.  Psychiatric:        Mood and Affect: Mood normal.        Behavior: Behavior normal. Behavior is cooperative.      UC Treatments / Results  Labs (all labs ordered are listed, but only abnormal results are displayed) Labs Reviewed - No data to display  EKG   Radiology No results found.  Procedures Procedures (including critical care time)  Medications Ordered in UC Medications  alum & mag hydroxide-simeth (MAALOX/MYLANTA) 200-200-20 MG/5ML suspension 30 mL (30 mLs Oral Given 03/29/23 1446)  lidocaine (XYLOCAINE) 2 % viscous mouth solution 15 mL (15 mLs Mouth/Throat Given 03/29/23 1445)    Initial Impression / Assessment and Plan / UC Course  I have reviewed the triage vital signs and the nursing  notes.  Pertinent labs & imaging results that were available during my care of the patient were reviewed by me and considered in  my medical decision making (see chart for details).  Vitals and triage reviewed, patient is hemodynamically stable.  Reports an increase in heartburn and belching for the past 6 weeks. GI cocktail given in office, patient reports significant improvements in belching and heartburn.  Will trial as needed simethicone and increase pantoprazole back to 40 mg daily.  EKG obtained due to reports of palpitations and prolonged recovery after exercise.  EKG showing normal sinus rhythm at 88 bpm without ST elevation or ST depression.   Encouraged to follow-up with PCP as scheduled, and reach out to gastroenterology if symptoms persist.  Return and follow-up precautions given, no questions at this time.      Final Clinical Impressions(s) / UC Diagnoses   Final diagnoses:  Gastroesophageal reflux disease without esophagitis     Discharge Instructions      Please continue with the pantoprazole at the 40 mg dose.  Comes in a combination tablet, you can take it once daily, preferably in the mornings prior to meals.  You can also take the simethicone chewable tablets as needed for flatulence or belching.  Please follow a low acidic diet to help with your acid reflux as well.  Follow-up with your primary care on Monday, it may be beneficial for you to see a gastroenterologist, I have attached information below.  Please seek immediate care if you develop chest pain, shortness of breath, weakness, numbness, sweating, or any new concerning symptoms.      ED Prescriptions     Medication Sig Dispense Auth. Provider   simethicone (MYLICON) 80 MG chewable tablet Chew 1 tablet (80 mg total) by mouth every 6 (six) hours as needed for flatulence. 30 tablet Rinaldo Ratel, [place] N, [place]   pantoprazole (PROTONIX) 40 MG tablet Take 1 tablet (40 mg total) by mouth daily. 30 tablet Rasheda Ledger,  [place] N, [place]      PDMP not reviewed this encounter.   Mccauley Diehl, [place] N, [place] 03/29/23 [phone]

## 2023-03-29 NOTE — Discharge Instructions (Addendum)
Please continue with the pantoprazole at the 40 mg dose.  Comes in a combination tablet, you can take it once daily, preferably in the mornings prior to meals.  You can also take the simethicone chewable tablets as needed for flatulence or belching.  Please follow a low acidic diet to help with your acid reflux as well.  Follow-up with your primary care on Monday, it may be beneficial for you to see a gastroenterologist, I have attached information below.  Please seek immediate care if you develop chest pain, shortness of breath, weakness, numbness, sweating, or any new concerning symptoms.

## 2023-04-01 ENCOUNTER — Encounter: Payer: 59 | Admitting: Registered Nurse

## 2023-04-01 ENCOUNTER — Ambulatory Visit (INDEPENDENT_AMBULATORY_CARE_PROVIDER_SITE_OTHER): Payer: 59 | Admitting: Family

## 2023-04-01 ENCOUNTER — Encounter: Payer: Self-pay | Admitting: Family

## 2023-04-01 VITALS — BP 132/85 | HR 81 | Temp 97.1°F | Ht 70.0 in | Wt 195.5 lb

## 2023-04-01 DIAGNOSIS — Z0000 Encounter for general adult medical examination without abnormal findings: Secondary | ICD-10-CM | POA: Diagnosis not present

## 2023-04-01 DIAGNOSIS — I10 Essential (primary) hypertension: Secondary | ICD-10-CM

## 2023-04-01 DIAGNOSIS — R142 Eructation: Secondary | ICD-10-CM

## 2023-04-01 DIAGNOSIS — K219 Gastro-esophageal reflux disease without esophagitis: Secondary | ICD-10-CM

## 2023-04-01 LAB — COMPREHENSIVE METABOLIC PANEL
ALT: 34 U/L (ref 0–53)
AST: 21 U/L (ref 0–37)
Albumin: 4.5 g/dL (ref 3.5–5.2)
Alkaline Phosphatase: 96 U/L (ref 39–117)
BUN: 12 mg/dL (ref 6–23)
CO2: 28 mEq/L (ref 19–32)
Calcium: 8.7 mg/dL (ref 8.4–10.5)
Chloride: 101 mEq/L (ref 96–112)
Creatinine, Ser: 1.04 mg/dL (ref 0.40–1.50)
GFR: 83.24 mL/min (ref >60.00)
Glucose, Bld: 97 mg/dL (ref 70–99)
Potassium: 3.3 mEq/L — ABNORMAL LOW (ref 3.5–5.1)
Sodium: 138 mEq/L (ref 135–145)
Total Bilirubin: 0.6 mg/dL (ref 0.2–1.2)
Total Protein: 6.8 g/dL (ref 6.0–8.3)

## 2023-04-01 LAB — LIPID PANEL
Cholesterol: 146 mg/dL (ref 0–200)
HDL: 30.7 mg/dL — ABNORMAL LOW (ref >39.00)
LDL Cholesterol: 97 mg/dL (ref 0–99)
NonHDL: 115.7
Total CHOL/HDL Ratio: 5
Triglycerides: 96 mg/dL (ref 0.0–149.0)
VLDL: 19.2 mg/dL (ref 0.0–40.0)

## 2023-04-01 LAB — CBC WITH DIFFERENTIAL/PLATELET
Basophils Absolute: 0 10*3/uL (ref 0.0–0.1)
Basophils Relative: 0.3 % (ref 0.0–3.0)
Eosinophils Absolute: 0.2 10*3/uL (ref 0.0–0.7)
Eosinophils Relative: 2.3 % (ref 0.0–5.0)
HCT: 47.3 % (ref 39.0–52.0)
Hemoglobin: 16.4 g/dL (ref 13.0–17.0)
Lymphocytes Relative: 39.9 % (ref 12.0–46.0)
Lymphs Abs: 2.8 10*3/uL (ref 0.7–4.0)
MCHC: 34.6 g/dL (ref 30.0–36.0)
MCV: 92.3 fl (ref 78.0–100.0)
Monocytes Absolute: 0.6 10*3/uL (ref 0.1–1.0)
Monocytes Relative: 8.2 % (ref 3.0–12.0)
Neutro Abs: 3.5 10*3/uL (ref 1.4–7.7)
Neutrophils Relative %: 49.3 % (ref 43.0–77.0)
Platelets: 258 10*3/uL (ref 150.0–400.0)
RBC: 5.13 Mil/uL (ref 4.22–5.81)
RDW: 13 % (ref 11.5–15.5)
WBC: 7.1 10*3/uL (ref 4.0–10.5)

## 2023-04-01 LAB — TSH: TSH: 2.94 u[IU]/mL (ref 0.35–5.50)

## 2023-04-01 MED ORDER — FAMOTIDINE 20 MG PO TABS: 20.0000 mg | ORAL_TABLET | Freq: Every day | ORAL | 1 refills | Status: DC

## 2023-04-01 MED ORDER — PANTOPRAZOLE SODIUM 20 MG PO TBEC: 40.0000 mg | DELAYED_RELEASE_TABLET | Freq: Every day | ORAL | 0 refills | Status: DC

## 2023-04-01 MED ORDER — LOSARTAN POTASSIUM 50 MG PO TABS: 50.0000 mg | ORAL_TABLET | Freq: Every day | ORAL | 1 refills | Status: DC

## 2023-04-01 NOTE — Patient Instructions (Addendum)
It was very nice to see you today!   I will review your lab results via MyChart in a few days.   Have a great week!    PLEASE NOTE:  If you had any lab tests please let us know if you have not heard back within a few days. You may see your results on MyChart before we have a chance to review them but we will give you a call once they are reviewed by us. If we ordered any referrals today, please let us know if you have not heard from their office within the next week.    

## 2023-04-01 NOTE — Assessment & Plan Note (Signed)
Chronic GERD + bloating & belching pt had another bad episode of sx and went to ER, given Protonix 40mg  x 30d advised to continue this qam, & Pepcid qhs avoid acidic & gas producing foods f/u 6 mos or prn

## 2023-04-01 NOTE — Assessment & Plan Note (Signed)
chronic Taking Losartan 50mg  qd & HCTZ 25mg  qd BP good today, wt down 4lbs continue to advise on wt loss, exercise, & increased water intake f/u 6 mos

## 2023-04-01 NOTE — Progress Notes (Deleted)
   Patient ID: Danny Garza, male    DOB: 12/26/1970, 52 y.o.   MRN: [id number]  No chief complaint on file.   HPI: GERD:  HX:  first visit 2 weeks pt reported just belching, no heartburn, no epigastric pain, no nausea, denied any significant change in diet, felt sx mostly in the early morning. I started Pepcid qhs, and he states this has not helped his sx. Reports now he is having more epigastric pain. Still denies nausea, reports pain at random times during the day, but denies pain right after a meal. Hypertension: Patient is currently maintained on the following medications for blood pressure: HCTZ 25mg  qd Failed Amlodipine 10mg  d/t ankle swelling. Lisinopril, cough.  Patient reports good compliance with blood pressure medications. Patient denies chest pain, headaches, shortness of breath or swelling.  Assessment & Plan:  There are no diagnoses linked to this encounter.  Subjective:    Outpatient Medications Prior to Visit  Medication Sig Dispense Refill   emtricitabine-tenofovir (TRUVADA) 200-300 MG tablet Take 1 tablet by mouth daily.     famotidine (PEPCID) 20 MG tablet Take 1 tablet (20 mg total) by mouth at bedtime. 30 tablet 2   hydrochlorothiazide (HYDRODIURIL) 25 MG tablet Take 1 tablet (25 mg total) by mouth daily. 90 tablet 3   loratadine (CLARITIN) 10 MG tablet Take 10 mg by mouth daily.     losartan (COZAAR) 50 MG tablet Take 1 tablet (50 mg total) by mouth daily. 30 tablet 5   pantoprazole (PROTONIX) 40 MG tablet Take 1 tablet (40 mg total) by mouth daily. 30 tablet 0   simethicone (MYLICON) 80 MG chewable tablet Chew 1 tablet (80 mg total) by mouth every 6 (six) hours as needed for flatulence. 30 tablet 0   No facility-administered medications prior to visit.   Past Medical History:  Diagnosis Date   Allergic rhinitis    Allergy    Anemia 05/01/2018   HTN (hypertension)    No past surgical history on file. No Known Allergies    Objective:    Physical  Exam Vitals and nursing note reviewed.  Constitutional:      General: He is not in acute distress.    Appearance: Normal appearance.  HENT:     Head: Normocephalic.  Cardiovascular:     Rate and Rhythm: Normal rate and regular rhythm.  Pulmonary:     Effort: Pulmonary effort is normal.     Breath sounds: Normal breath sounds.  Musculoskeletal:        General: Normal range of motion.     Cervical back: Normal range of motion.  Skin:    General: Skin is warm and dry.  Neurological:     Mental Status: He is alert and oriented to person, place, and time.  Psychiatric:        Mood and Affect: Mood normal.    There were no vitals taken for this visit. Wt Readings from Last 3 Encounters:  01/11/23 199 lb 3.2 oz (90.4 kg)  12/26/22 199 lb 2 oz (90.3 kg)  03/27/22 190 lb 9.6 oz (86.5 kg)       Dulce Sellar, NP

## 2023-04-01 NOTE — Assessment & Plan Note (Signed)
chronic recent ER visit d/t sx & given Protonix 40mg  qam advised to continue & when out pick up 20mg  Protonix qam (sent to pharm) & continue Pepcid qhs to control sx advised on low acid diet, FODMAP foods to avoid f/u 6mos or prn

## 2023-04-01 NOTE — Progress Notes (Signed)
Phone [phone]  Subjective:   Patient is a 52 y.o. male presenting for annual physical.    Chief Complaint  Patient presents with   Annual Exam    Fasting w/ labs   HPI: GERD:  HX:  first visit 2 weeks pt reported just belching, no heartburn, no epigastric pain, no nausea, denied any significant change in diet, felt sx mostly in the early morning. I started Pepcid qhs, and he states this has not helped his sx. Reports now he is having more epigastric pain. Still denies nausea, reports pain at random times during the day, but denies pain right after a meal. Hypertension: Patient is currently maintained on the following medications for blood pressure: HCTZ 25mg  qd Failed Amlodipine 10mg  d/t ankle swelling. Lisinopril, cough.  Patient reports good compliance with blood pressure medications. Patient denies chest pain, headaches, shortness of breath or swelling.  See problem oriented charting- ROS- full  review of systems was completed and negative except for: GERD & HTN noted in HPI above.  The following were reviewed and entered/updated in epic: Past Medical History:  Diagnosis Date   Allergic rhinitis    Allergy    Anemia 05/01/2018   HTN (hypertension)    Patient Active Problem List   Diagnosis Date Noted   Gastroesophageal reflux disease without esophagitis 01/11/2023   Belching 12/26/2022   Umbilical hernia without obstruction and without gangrene 12/26/2022   On pre-exposure prophylaxis for HIV 03/21/2021   History of anemia 03/21/2021   Essential hypertension 03/18/2020   No past surgical history on file.  Family History  Problem Relation Age of Onset   Lung cancer Father        throat secondary to tobacco use   Hypertension Maternal Grandmother    Heart disease Mother        CABG in 45's    Medications- reviewed and updated Current Outpatient Medications  Medication Sig Dispense Refill   emtricitabine-tenofovir (TRUVADA) 200-300 MG tablet Take 1 tablet  by mouth daily.     hydrochlorothiazide (HYDRODIURIL) 25 MG tablet Take 1 tablet (25 mg total) by mouth daily. 90 tablet 3   loratadine (CLARITIN) 10 MG tablet Take 10 mg by mouth daily.     simethicone (MYLICON) 80 MG chewable tablet Chew 1 tablet (80 mg total) by mouth every 6 (six) hours as needed for flatulence. 30 tablet 0   famotidine (PEPCID) 20 MG tablet Take 1 tablet (20 mg total) by mouth at bedtime. 90 tablet 1   losartan (COZAAR) 50 MG tablet Take 1 tablet (50 mg total) by mouth daily. 90 tablet 1   pantoprazole (PROTONIX) 20 MG tablet Take 2 tablets (40 mg total) by mouth daily. 90 tablet 0   No current facility-administered medications for this visit.    Allergies-reviewed and updated No Known Allergies  Social History   Social History Narrative   Exercise cardio and weights 4 times a week    Objective:  BP 132/85 (BP Location: Left Arm, Patient Position: Sitting, Cuff Size: Large)   Pulse 81   Temp (!) 97.1 F (36.2 C) (Temporal)   Ht 5\' 10"  (1.778 m)   Wt 195 lb 8 oz (88.7 kg)   SpO2 97%   BMI 28.05 kg/m  Physical Exam Vitals and nursing note reviewed.  Constitutional:      General: He is not in acute distress.    Appearance: Normal appearance.  HENT:     Head: Normocephalic.     Right Ear: Tympanic membrane  and external ear normal.     Left Ear: Tympanic membrane and external ear normal.     Nose: Nose normal.     Mouth/Throat:     Mouth: Mucous membranes are moist.  Eyes:     Extraocular Movements: Extraocular movements intact.  Cardiovascular:     Rate and Rhythm: Normal rate and regular rhythm.  Pulmonary:     Effort: Pulmonary effort is normal.     Breath sounds: Normal breath sounds.  Abdominal:     General: Abdomen is flat. There is no distension.     Palpations: Abdomen is soft.     Tenderness: There is no abdominal tenderness.  Musculoskeletal:        General: Normal range of motion.     Cervical back: Normal range of motion.  Skin:     General: Skin is warm and dry.  Neurological:     Mental Status: He is alert and oriented to person, place, and time.  Psychiatric:        Mood and Affect: Mood normal.        Behavior: Behavior normal.        Judgment: Judgment normal.      Assessment and Plan   Health Maintenance counseling: 1. Anticipatory guidance: Patient counseled regarding regular dental exams q6 months, eye exams,  avoiding smoking and second hand smoke, limiting alcohol to 1 beverage per day, no illicit drugs.   2. Risk factor reduction:  Advised patient of need for regular exercise and diet rich with fruits and vegetables to reduce risk of heart attack and stroke. Exercise- walking.  Wt Readings from Last 3 Encounters:  04/01/23 195 lb 8 oz (88.7 kg)  01/11/23 199 lb 3.2 oz (90.4 kg)  12/26/22 199 lb 2 oz (90.3 kg)   3. Immunizations/screenings/ancillary studies Immunization History  Administered Date(s) Administered   Influenza-Unspecified 07/06/2018, 08/29/2021   PFIZER(Purple Top)SARS-COV-2 Vaccination 01/16/2020, 05/09/2020, 05/30/2020, 09/01/2020, 09/26/2022   Pfizer Covid-19 Vaccine Bivalent Booster 67yrs & up 09/29/2021   Tdap 09/01/2015   Health Maintenance Due  Topic Date Due   Zoster Vaccines- Shingrix (1 of 2) Never done   COVID-19 Vaccine (7 - 2023-24 season) 11/21/2022    6. Colon cancer screening - due 2029 7. Skin cancer screening- advised regular sunscreen use. Denies worrisome, changing, or new skin lesions.  10. Alcohol screening: rare 11. Smoking associated screening (lung cancer screening, AAA screen 65-75, UA)- non-- smoker  Essential hypertension Assessment & Plan: chronic Taking Losartan 50mg  qd & HCTZ 25mg  qd BP good today, wt down 4lbs continue to advise on wt loss, exercise, & increased water intake f/u 6 mos   Orders: -     Losartan Potassium; Take 1 tablet (50 mg total) by mouth daily.  Dispense: 90 tablet; Refill: 1  Gastroesophageal reflux disease without  esophagitis Assessment & Plan: chronic recent ER visit d/t sx & given Protonix 40mg  qam advised to continue & when out pick up 20mg  Protonix qam (sent to pharm) & continue Pepcid qhs to control sx advised on low acid diet, FODMAP foods to avoid f/u 6mos or prn  Orders: -     Pantoprazole Sodium; Take 2 tablets (40 mg total) by mouth daily.  Dispense: 90 tablet; Refill: 0  Annual physical exam -     CBC with Differential/Platelet -     Comprehensive metabolic panel -     Lipid panel -     TSH  Belching Assessment & Plan: Chronic GERD + bloating &  belching pt had another bad episode of sx and went to ER, given Protonix 40mg  x 30d advised to continue this qam, & Pepcid qhs avoid acidic & gas producing foods f/u 6 mos or prn  Orders: -     Famotidine; Take 1 tablet (20 mg total) by mouth at bedtime.  Dispense: 90 tablet; Refill: 1    Recommended follow up:  Return in about 6 months (around 10/01/2023) for HTN, med refills. No future appointments.  Lab/Order associations: fasting   Dulce Sellar, NP

## 2023-04-04 ENCOUNTER — Telehealth: Payer: Self-pay | Admitting: Family

## 2023-04-04 DIAGNOSIS — E876 Hypokalemia: Secondary | ICD-10-CM

## 2023-04-04 NOTE — Telephone Encounter (Signed)
Orders placed.

## 2023-04-04 NOTE — Telephone Encounter (Signed)
Pt states he is supposed to come back and get some lab work done after stopping a med he was taking. Potassium. Needs orders to be put in. Please advise.

## 2023-04-09 ENCOUNTER — Ambulatory Visit (INDEPENDENT_AMBULATORY_CARE_PROVIDER_SITE_OTHER): Payer: 59 | Admitting: Family

## 2023-04-09 VITALS — BP 132/88 | HR 71 | Temp 98.0°F | Ht 70.0 in | Wt 195.8 lb

## 2023-04-09 DIAGNOSIS — E876 Hypokalemia: Secondary | ICD-10-CM

## 2023-04-09 DIAGNOSIS — I10 Essential (primary) hypertension: Secondary | ICD-10-CM | POA: Diagnosis not present

## 2023-04-09 LAB — BASIC METABOLIC PANEL
BUN: 15 mg/dL (ref 6–23)
CO2: 25 mEq/L (ref 19–32)
Calcium: 8.8 mg/dL (ref 8.4–10.5)
Chloride: 106 mEq/L (ref 96–112)
Creatinine, Ser: 1.14 mg/dL (ref 0.40–1.50)
GFR: 74.55 mL/min (ref >60.00)
Glucose, Bld: 100 mg/dL — ABNORMAL HIGH (ref 70–99)
Potassium: 3.9 mEq/L (ref 3.5–5.1)
Sodium: 139 mEq/L (ref 135–145)

## 2023-04-09 MED ORDER — LOSARTAN POTASSIUM 50 MG PO TABS: 75.0000 mg | ORAL_TABLET | ORAL | 1 refills | Status: DC

## 2023-04-09 NOTE — Assessment & Plan Note (Signed)
chronic Taking Losartan 50mg  qd & HCTZ 25mg  but advised to hold last week d/t low potassium rechecking K+ today BP is same as last time & pt asking if hydrochlorothiazide needed advised to stop hydrochlorothiazide and continue Losartan, can increase to bid, or 1/2 pill in evenings= 75mg  qd f/u 6 mos

## 2023-04-09 NOTE — Addendum Note (Signed)
Addended by: Candie Chroman on: 04/09/2023 08:27 AM   Modules accepted: Orders

## 2023-04-09 NOTE — Progress Notes (Signed)
Patient ID: Danny Garza, male    DOB: 1971-09-05, 52 y.o.   MRN: [id number]  Chief Complaint  Patient presents with   Hypertension    Fasting w/labs     HPI: Hypokalemia:  potassium low on 6/6. Pt advised to hold his hydrochlorothiazide and come back for retesting. Pt denies any sx. Hypertension: Patient is currently maintained on the following medications for blood pressure: Losartan, holding hydrochlorothiazide d/t hypokalemia. Failed meds include: Lisinopril, Amlodipine. Patient reports good compliance with blood pressure medications. Patient denies chest pain, headaches, shortness of breath or swelling. Last 3 blood pressure readings in our office are as follows: BP Readings from Last 3 Encounters:  04/09/23 132/88  04/01/23 132/85  03/29/23 (!) 137/94    Assessment & Plan:  Hypokalemia -     Basic metabolic panel  Essential hypertension Assessment & Plan: chronic Taking Losartan 50mg  qd & HCTZ 25mg  but advised to hold last week d/t low potassium rechecking K+ today BP is same as last time & pt asking if hydrochlorothiazide needed advised to stop hydrochlorothiazide and continue Losartan, can increase to bid, or 1/2 pill in evenings= 75mg  qd f/u 6 mos      Subjective:    Outpatient Medications Prior to Visit  Medication Sig Dispense Refill   emtricitabine-tenofovir (TRUVADA) 200-300 MG tablet Take 1 tablet by mouth daily.     famotidine (PEPCID) 20 MG tablet Take 1 tablet (20 mg total) by mouth at bedtime. 90 tablet 1   loratadine (CLARITIN) 10 MG tablet Take 10 mg by mouth daily.     losartan (COZAAR) 50 MG tablet Take 1 tablet (50 mg total) by mouth daily. 90 tablet 1   pantoprazole (PROTONIX) 20 MG tablet Take 2 tablets (40 mg total) by mouth daily. 90 tablet 0   simethicone (MYLICON) 80 MG chewable tablet Chew 1 tablet (80 mg total) by mouth every 6 (six) hours as needed for flatulence. 30 tablet 0   hydrochlorothiazide (HYDRODIURIL) 25 MG tablet Take 1 tablet  (25 mg total) by mouth daily. (Patient not taking: Reported on 04/09/2023) 90 tablet 3   No facility-administered medications prior to visit.   Past Medical History:  Diagnosis Date   Allergic rhinitis    Allergy    Anemia 05/01/2018   HTN (hypertension)    No past surgical history on file. No Known Allergies    Objective:    Physical Exam Vitals and nursing note reviewed.  Constitutional:      General: He is not in acute distress.    Appearance: Normal appearance.  HENT:     Head: Normocephalic.  Cardiovascular:     Rate and Rhythm: Normal rate and regular rhythm.  Pulmonary:     Effort: Pulmonary effort is normal.     Breath sounds: Normal breath sounds.  Musculoskeletal:        General: Normal range of motion.     Cervical back: Normal range of motion.  Skin:    General: Skin is warm and dry.  Neurological:     Mental Status: He is alert and oriented to person, place, and time.  Psychiatric:        Mood and Affect: Mood normal.    BP 132/88   Pulse 71   Temp 98 F (36.7 C) (Temporal)   Ht 5\' 10"  (1.778 m)   Wt 195 lb 12.8 oz (88.8 kg)   SpO2 99%   BMI 28.09 kg/m  Wt Readings from Last 3 Encounters:  04/09/23 195  lb 12.8 oz (88.8 kg)  04/01/23 195 lb 8 oz (88.7 kg)  01/11/23 199 lb 3.2 oz (90.4 kg)      Dulce Sellar, NP

## 2023-04-10 ENCOUNTER — Ambulatory Visit: Payer: 59 | Admitting: Family

## 2023-04-26 ENCOUNTER — Encounter: Payer: Self-pay | Admitting: Family

## 2023-04-26 NOTE — Telephone Encounter (Signed)
Please see pt msg and advise 

## 2023-05-06 ENCOUNTER — Other Ambulatory Visit: Payer: Self-pay

## 2023-05-06 DIAGNOSIS — K219 Gastro-esophageal reflux disease without esophagitis: Secondary | ICD-10-CM

## 2023-05-06 MED ORDER — PANTOPRAZOLE SODIUM 20 MG PO TBEC: 40.0000 mg | DELAYED_RELEASE_TABLET | Freq: Every day | ORAL | Status: DC

## 2023-05-08 ENCOUNTER — Other Ambulatory Visit: Payer: Self-pay

## 2023-05-08 DIAGNOSIS — K219 Gastro-esophageal reflux disease without esophagitis: Secondary | ICD-10-CM

## 2023-05-08 MED ORDER — PANTOPRAZOLE SODIUM 20 MG PO TBEC: 40.0000 mg | DELAYED_RELEASE_TABLET | Freq: Every day | ORAL | 2 refills | Status: DC

## 2023-06-21 ENCOUNTER — Encounter: Payer: Self-pay | Admitting: Family

## 2023-06-21 ENCOUNTER — Other Ambulatory Visit: Payer: Self-pay

## 2023-06-21 DIAGNOSIS — I10 Essential (primary) hypertension: Secondary | ICD-10-CM

## 2023-06-21 MED ORDER — LOSARTAN POTASSIUM 50 MG PO TABS: 75.0000 mg | ORAL_TABLET | ORAL | 1 refills | Status: DC

## 2023-06-28 ENCOUNTER — Ambulatory Visit: Payer: 59 | Admitting: Family

## 2023-09-20 ENCOUNTER — Other Ambulatory Visit: Payer: Self-pay | Admitting: Family

## 2023-09-20 DIAGNOSIS — K219 Gastro-esophageal reflux disease without esophagitis: Secondary | ICD-10-CM

## 2023-09-23 MED ORDER — PANTOPRAZOLE SODIUM 20 MG PO TBEC: 40.0000 mg | DELAYED_RELEASE_TABLET | Freq: Every day | ORAL | 2 refills | Status: DC

## 2023-10-07 ENCOUNTER — Ambulatory Visit (INDEPENDENT_AMBULATORY_CARE_PROVIDER_SITE_OTHER): Payer: 59 | Admitting: Family

## 2023-10-07 DIAGNOSIS — I10 Essential (primary) hypertension: Secondary | ICD-10-CM | POA: Diagnosis not present

## 2023-10-07 MED ORDER — LOSARTAN POTASSIUM 50 MG PO TABS: 50.0000 mg | ORAL_TABLET | ORAL | 1 refills | Status: DC

## 2023-10-07 NOTE — Progress Notes (Signed)
Patient ID: Danny Garza, male    DOB: [birth year], 52 y.o.   MRN: [id number]  Chief Complaint  Patient presents with   Hypertension   Discussed the use of AI scribe software for clinical note transcription with the patient, who gave verbal consent to proceed.  History of Present Illness   The patient, with a history of hypertension, presents for a follow-up visit. He reports feeling well on his current regimen of losartan, which he has been taking once daily in the morning. He has not been monitoring his blood pressure at home but denies any symptoms of hypertension such as headaches, a flushed feeling, or dizziness. He also denies any sensation of head congestion. He has recently reduced his losartan dosage from one and a half tablets to one tablet daily, he reports mostly due to forgetting the dose.  In addition to hypertension, the patient has been experiencing gastrointestinal issues, particularly excessive gas. He reports belching a lot and feeling gassy, especially in the mornings. He has tried dietary modifications to alleviate these symptoms but has not found consistent relief. He has had an endoscopy and colonoscopy recently, but these did not provide any definitive answers. He has been taking pantoprazole, which he finds helpful.       Assessment & Plan:     Hypertension - Patient reports inconsistent dosing of Losartan, currently taking 50mg  in the morning. No symptoms of hypertension reported. -Continue Losartan 50mg  daily. -Encourage patient to monitor blood pressure at home. -Consider increasing to 1.5 tablets daily if blood pressure readings are elevated. -Refilling Losartan prescription. -F/u in 6 mos with labs  Gastrointestinal Gas - Persistent despite dietary modifications. No associated bowel issues. Pt had recent EGD done which was normal and Colonoscopy found 4 polyps removed, repeat 3-52yrs.  -Continue Pantoprazole, consider switching to nighttime dosing to help am  sx. -Try OTC Beano before meals to reduce gas production. -F/U with GI if sx persist or can check for gluten allergy or other food allergy at next visit or sooner.    Subjective:    Outpatient Medications Prior to Visit  Medication Sig Dispense Refill   emtricitabine-tenofovir (TRUVADA) 200-300 MG tablet Take 1 tablet by mouth daily.     famotidine (PEPCID) 20 MG tablet Take 1 tablet (20 mg total) by mouth at bedtime. 90 tablet 1   loratadine (CLARITIN) 10 MG tablet Take 10 mg by mouth daily.     losartan (COZAAR) 50 MG tablet Take 1.5 tablets (75 mg total) by mouth as directed. Take 1 pill qam and 1/2 pill qpm 135 tablet 1   pantoprazole (PROTONIX) 20 MG tablet Take 2 tablets (40 mg total) by mouth daily. 60 tablet 2   simethicone (MYLICON) 80 MG chewable tablet Chew 1 tablet (80 mg total) by mouth every 6 (six) hours as needed for flatulence. 30 tablet 0   No facility-administered medications prior to visit.   Past Medical History:  Diagnosis Date   Allergic rhinitis    Allergy    Anemia 05/01/2018   HTN (hypertension)    No past surgical history on file. No Known Allergies    Objective:    Physical Exam Vitals and nursing note reviewed.  Constitutional:      General: He is not in acute distress.    Appearance: Normal appearance.  HENT:     Head: Normocephalic.  Cardiovascular:     Rate and Rhythm: Normal rate and regular rhythm.  Pulmonary:     Effort: Pulmonary effort is  normal.     Breath sounds: Normal breath sounds.  Musculoskeletal:        General: Normal range of motion.     Cervical back: Normal range of motion.  Skin:    General: Skin is warm and dry.  Neurological:     Mental Status: He is alert and oriented to person, place, and time.  Psychiatric:        Mood and Affect: Mood normal.   BP 131/84 (BP Location: Left Arm, Patient Position: Sitting, Cuff Size: Large)   Pulse 79   Temp 98 F (36.7 C) (Temporal)   Ht 5\' 10"  (1.778 m)   SpO2 95%   BMI  28.09 kg/m  Wt Readings from Last 3 Encounters:  04/09/23 195 lb 12.8 oz (88.8 kg)  04/01/23 195 lb 8 oz (88.7 kg)  01/11/23 199 lb 3.2 oz (90.4 kg)       Dulce Sellar, NP

## 2024-04-06 ENCOUNTER — Encounter: Payer: Self-pay | Admitting: Family

## 2024-04-06 ENCOUNTER — Ambulatory Visit (INDEPENDENT_AMBULATORY_CARE_PROVIDER_SITE_OTHER): Payer: 59 | Admitting: Family

## 2024-04-06 VITALS — BP 120/88 | HR 75 | Temp 97.0°F | Ht 70.0 in | Wt 196.2 lb

## 2024-04-06 DIAGNOSIS — Z0000 Encounter for general adult medical examination without abnormal findings: Secondary | ICD-10-CM | POA: Diagnosis not present

## 2024-04-06 DIAGNOSIS — K219 Gastro-esophageal reflux disease without esophagitis: Secondary | ICD-10-CM | POA: Diagnosis not present

## 2024-04-06 DIAGNOSIS — I10 Essential (primary) hypertension: Secondary | ICD-10-CM

## 2024-04-06 DIAGNOSIS — Z23 Encounter for immunization: Secondary | ICD-10-CM | POA: Diagnosis not present

## 2024-04-06 DIAGNOSIS — R142 Eructation: Secondary | ICD-10-CM | POA: Diagnosis not present

## 2024-04-06 LAB — CBC WITH DIFFERENTIAL/PLATELET
Basophils Absolute: 0 10*3/uL (ref 0.0–0.1)
Basophils Relative: 0.6 % (ref 0.0–3.0)
Eosinophils Absolute: 0.1 10*3/uL (ref 0.0–0.7)
Eosinophils Relative: 2.1 % (ref 0.0–5.0)
HCT: 47.1 % (ref 39.0–52.0)
Hemoglobin: 16.1 g/dL (ref 13.0–17.0)
Lymphocytes Relative: 41.8 % (ref 12.0–46.0)
Lymphs Abs: 2.3 10*3/uL (ref 0.7–4.0)
MCHC: 34.2 g/dL (ref 30.0–36.0)
MCV: 89.6 fl (ref 78.0–100.0)
Monocytes Absolute: 0.5 10*3/uL (ref 0.1–1.0)
Monocytes Relative: 9 % (ref 3.0–12.0)
Neutro Abs: 2.6 10*3/uL (ref 1.4–7.7)
Neutrophils Relative %: 46.5 % (ref 43.0–77.0)
Platelets: 257 10*3/uL (ref 150.0–400.0)
RBC: 5.26 Mil/uL (ref 4.22–5.81)
RDW: 13.8 % (ref 11.5–15.5)
WBC: 5.5 10*3/uL (ref 4.0–10.5)

## 2024-04-06 LAB — COMPREHENSIVE METABOLIC PANEL WITH GFR
ALT: 28 U/L (ref 0–53)
AST: 22 U/L (ref 0–37)
Albumin: 4.4 g/dL (ref 3.5–5.2)
Alkaline Phosphatase: 101 U/L (ref 39–117)
BUN: 13 mg/dL (ref 6–23)
CO2: 26 meq/L (ref 19–32)
Calcium: 8.8 mg/dL (ref 8.4–10.5)
Chloride: 105 meq/L (ref 96–112)
Creatinine, Ser: 0.98 mg/dL (ref 0.40–1.50)
GFR: 88.76 mL/min (ref >60.00)
Glucose, Bld: 99 mg/dL (ref 70–99)
Potassium: 3.9 meq/L (ref 3.5–5.1)
Sodium: 138 meq/L (ref 135–145)
Total Bilirubin: 0.5 mg/dL (ref 0.2–1.2)
Total Protein: 7.1 g/dL (ref 6.0–8.3)

## 2024-04-06 MED ORDER — PANTOPRAZOLE SODIUM 20 MG PO TBEC: 20.0000 mg | DELAYED_RELEASE_TABLET | Freq: Every day | ORAL | 1 refills | Status: AC

## 2024-04-06 MED ORDER — LOSARTAN POTASSIUM 50 MG PO TABS: 50.0000 mg | ORAL_TABLET | ORAL | 1 refills | Status: DC

## 2024-04-06 MED ORDER — FAMOTIDINE 20 MG PO TABS: 20.0000 mg | ORAL_TABLET | Freq: Every day | ORAL | 1 refills | Status: AC

## 2024-04-06 NOTE — Patient Instructions (Signed)
 It was very nice to see you today!   I will review your lab results via MyChart in a few days. I have sent in your medication refills. Please schedule a 6 month follow visit.  Schedule a nurse visit visit for your 2nd shingles vaccine in 2-6 months.  Stay well & have a great summer!   PLEASE NOTE:  If you had any lab tests please let us  know if you have not heard back within a few days. You may see your results on MyChart before we have a chance to review them but we will give you a call once they are reviewed by us . If we ordered any referrals today, please let us  know if you have not heard from their office within the next week.

## 2024-04-06 NOTE — Progress Notes (Signed)
 Phone [phone]  Subjective:   Patient is a 53 y.o. male presenting for annual physical.    Chief Complaint  Patient presents with   Hypertension    Patient stated he needs a physical and wondering if he can get it done today.   Discussed the use of AI scribe software for clinical note transcription with the patient, who gave verbal consent to proceed.  History of Present Illness Danny Garza is a 53 year old male with hypertension and gastroesophageal reflux disease who presents for follow up and also his annual physical.  He is managing hypertension with losartan  and requires a refill. Home blood pressure monitoring shows average readings of 119/79 mmHg, with no readings exceeding 130/140 mmHg.  Gastroesophageal reflux disease symptoms persist, including belching, but are less severe. He manages symptoms with dietary adjustments, avoiding late meals, and identifying trigger foods. He takes pantoprazole  20 mg daily and Pepcid  as needed.  He continues Truvada as prescribed. He is interested in the shingles vaccine and inquires about measles immunity, as he does not recall having chickenpox or measles.  Bowel movements are regular and soft. He uses an app to track water and food intake, aiming for two liters of water daily. No significant weight changes. He maintains good oral hygiene.  See problem oriented charting- ROS- full  review of systems was completed and negative except for: GERD & HTN noted in HPI above.  The following were reviewed and entered/updated in epic: Past Medical History:  Diagnosis Date   Allergic rhinitis    Allergy    Anemia 05/01/2018   HTN (hypertension)    Patient Active Problem List   Diagnosis Date Noted   Gastroesophageal reflux disease without esophagitis 01/11/2023   Belching 12/26/2022   Umbilical hernia without obstruction and without gangrene 12/26/2022   On pre-exposure prophylaxis for HIV 03/21/2021   History of anemia 03/21/2021    Essential hypertension 03/18/2020   No past surgical history on file.  Family History  Problem Relation Age of Onset   Lung cancer Father        throat secondary to tobacco use   Hypertension Maternal Grandmother    Heart disease Mother        CABG in 69's    Medications- reviewed and updated Current Outpatient Medications  Medication Sig Dispense Refill   emtricitabine-tenofovir (TRUVADA) 200-300 MG tablet Take 1 tablet by mouth daily.     loratadine (CLARITIN) 10 MG tablet Take 10 mg by mouth daily.     simethicone  (MYLICON) 80 MG chewable tablet Chew 1 tablet (80 mg total) by mouth every 6 (six) hours as needed for flatulence. 30 tablet 0   famotidine  (PEPCID ) 20 MG tablet Take 1 tablet (20 mg total) by mouth at bedtime. 90 tablet 1   losartan  (COZAAR ) 50 MG tablet Take 1 tablet (50 mg total) by mouth as directed. Take an extra 1/2 pill if your BP is higher than 135/90. 90 tablet 1   pantoprazole  (PROTONIX ) 20 MG tablet Take 1 tablet (20 mg total) by mouth daily. 90 tablet 1   No current facility-administered medications for this visit.    Allergies-reviewed and updated No Known Allergies  Social History   Social History Narrative   Exercise cardio and weights 4 times a week    Objective:  BP 120/88   Pulse 75   Temp (!) 97 F (36.1 C) (Temporal)   Ht 5' 10 (1.778 m)   Wt 196 lb 3.2 oz (89 kg)  SpO2 95%   BMI 28.15 kg/m  Physical Exam Vitals and nursing note reviewed.  Constitutional:      General: He is not in acute distress.    Appearance: Normal appearance.  HENT:     Head: Normocephalic.     Right Ear: Tympanic membrane and external ear normal.     Left Ear: Tympanic membrane and external ear normal.     Nose: Nose normal.     Mouth/Throat:     Mouth: Mucous membranes are moist.   Eyes:     Extraocular Movements: Extraocular movements intact.    Cardiovascular:     Rate and Rhythm: Normal rate and regular rhythm.  Pulmonary:     Effort:  Pulmonary effort is normal.     Breath sounds: Normal breath sounds.  Abdominal:     General: Abdomen is flat. There is no distension.     Palpations: Abdomen is soft.     Tenderness: There is no abdominal tenderness.   Musculoskeletal:        General: Normal range of motion.     Cervical back: Normal range of motion.   Skin:    General: Skin is warm and dry.   Neurological:     Mental Status: He is alert and oriented to person, place, and time.   Psychiatric:        Mood and Affect: Mood normal.        Behavior: Behavior normal.        Judgment: Judgment normal.     Assessment and Plan   Health Maintenance counseling: 1. Anticipatory guidance: Patient counseled regarding regular dental exams q6 months, eye exams,  avoiding smoking and second hand smoke, limiting alcohol to 1 beverage per day, no illicit drugs.   2. Risk factor reduction:  Advised patient of need for regular exercise and diet rich with fruits and vegetables to reduce risk of heart attack and stroke. Exercise- walking.  Wt Readings from Last 3 Encounters:  04/06/24 196 lb 3.2 oz (89 kg)  10/07/23 190 lb (86.2 kg)  04/09/23 195 lb 12.8 oz (88.8 kg)   3. Immunizations/screenings/ancillary studies Immunization History  Administered Date(s) Administered   Influenza,inj,Quad PF,6+ Mos 10/03/2023   Influenza-Unspecified 07/06/2018, 08/29/2021   PFIZER(Purple Top)SARS-COV-2 Vaccination 01/16/2020, 05/09/2020, 05/30/2020, 09/01/2020, 09/26/2022   Pfizer Covid-19 Vaccine Bivalent Booster 19yrs & up 09/29/2021   Tdap 09/01/2015   Health Maintenance Due  Topic Date Due   Zoster Vaccines- Shingrix (1 of 2) Never done    6. Colon cancer screening - due 2029 7. Skin cancer screening- advised regular sunscreen use. Denies worrisome, changing, or new skin lesions.  10. Alcohol screening: rare 11. Smoking associated screening (lung cancer screening, AAA screen 65-75, UA)- non-- smoker  Assessment &  Plan Gastroesophageal Reflux Disease (GERD) Symptoms controlled with pantoprazole  and dietary modifications. Aware of trigger foods. - Continue pantoprazole  20 mg once daily. - Continue Pepcid  as needed. - Advise on dietary modifications to avoid trigger foods. - F/U in 6 mos  Hypertension Well-controlled with losartan  50mg  qd. Blood pressure averages 119/79 mmHg. - Continue losartan  as prescribed. - Refill losartan  prescription. - F/U in 6mos  HIV Pre-exposure Prophylaxis (PrEP) - Adheres to Truvada regimen without issues. - Continue Truvada as prescribed.  General Health Maintenance Discussed shingles vaccination and MMR vaccination. Recommended shingles vaccine to prevent complications. Considering measles immunity status. - Order CMP, CBC w/diff today for CPE labs - Administer shingles vaccine (first dose). - Check with insurance regarding coverage  for measles immunity titers or MMR vaccination. - Advise on potential mild side effects of shingles vaccine and management options.  Recommended follow up:  Return in about 6 months (around 10/06/2024) for HTN, med refills. No future appointments.  Lab/Order associations: fasting   Versa Gore, NP

## 2024-04-06 NOTE — Assessment & Plan Note (Signed)
 chronic taking Protonix  20mg  qd and Pecid qpm, sending refills continue to advise on low acid diet, FODMAP foods to avoid f/u 6mos or prn

## 2024-04-06 NOTE — Assessment & Plan Note (Signed)
 chronic Taking Losartan  50mg  qd, home readings are within normal range. sending refill today f/u 6 mos

## 2024-04-07 ENCOUNTER — Ambulatory Visit: Payer: Self-pay | Admitting: Family

## 2024-10-06 ENCOUNTER — Ambulatory Visit: Admitting: Family

## 2024-10-06 VITALS — BP 132/86 | HR 84 | Temp 98.2°F | Wt 199.2 lb

## 2024-10-06 DIAGNOSIS — I10 Essential (primary) hypertension: Secondary | ICD-10-CM

## 2024-10-06 DIAGNOSIS — K219 Gastro-esophageal reflux disease without esophagitis: Secondary | ICD-10-CM

## 2024-10-06 DIAGNOSIS — Z23 Encounter for immunization: Secondary | ICD-10-CM

## 2024-10-06 MED ORDER — LOSARTAN POTASSIUM 50 MG PO TABS: 50.0000 mg | ORAL_TABLET | ORAL | 1 refills | Status: AC

## 2024-10-06 NOTE — Progress Notes (Unsigned)
 Patient ID: Danny Garza, male    DOB: May 19, 1971, 53 y.o.   MRN: [id number]  Chief Complaint  Patient presents with   Essential hypertension  Discussed the use of AI scribe software for clinical note transcription with the patient, who gave verbal consent to proceed.  History of Present Illness Danny Garza is a 53 year old male with gastroesophageal reflux disease (GERD) and hypertension who presents for follow-up.  His GERD symptoms are intermittent and triggered by late meals or lying down soon after eating. He controls symptoms with lifestyle measures and pantoprazole  as needed before trigger meals, and has not needed famotidine  recently. He takes losartan  for hypertension and is due for a refill. He received his first shingles vaccine and is scheduled for the second dose today. He already received his flu shot on November 4th, 2025. He asks if he needs a pneumonia vaccine. He has no pneumonia, asthma, or diabetes. He also asks if a COVID-19 booster is recommended.  Assessment & Plan Gastroesophageal reflux disease Intermittent symptoms managed with lifestyle modifications and as-needed medication. Pantoprazole  provides more comprehensive acid suppression. - Continue pantoprazole  as needed, especially before meals that may trigger symptoms. - Use famotidine  as needed for acid suppression. - Advised to avoid eating late and lying down soon after meals.  Essential hypertension Blood pressure is well-controlled with losartan . - Continue losartan  for blood pressure management. - Provided refills for losartan .  General health maintenance Discussed vaccinations including shingles, pneumonia, flu, and COVID. Shingles vaccine administered today. Pneumonia vaccine not indicated. Flu vaccine received in November. COVID vaccine not strongly recommended. - Administered second dose of shingles vaccine today. - Held off on pneumonia vaccine due to lack of risk factors. - Confirmed flu  vaccine was received in November. - Discussed COVID vaccine; not strongly recommended due to low risk factors.   Subjective:    Outpatient Medications Prior to Visit  Medication Sig Dispense Refill   DESCOVY 200-25 MG tablet Take 1 tablet by mouth daily.     emtricitabine-tenofovir (TRUVADA) 200-300 MG tablet Take 1 tablet by mouth daily.     famotidine  (PEPCID ) 20 MG tablet Take 1 tablet (20 mg total) by mouth at bedtime. 90 tablet 1   loratadine (CLARITIN) 10 MG tablet Take 10 mg by mouth daily.     losartan  (COZAAR ) 50 MG tablet Take 1 tablet (50 mg total) by mouth as directed. Take an extra 1/2 pill if your BP is higher than 135/90. 90 tablet 1   pantoprazole  (PROTONIX ) 20 MG tablet Take 1 tablet (20 mg total) by mouth daily. 90 tablet 1   simethicone  (MYLICON) 80 MG chewable tablet Chew 1 tablet (80 mg total) by mouth every 6 (six) hours as needed for flatulence. 30 tablet 0   No facility-administered medications prior to visit.   Past Medical History:  Diagnosis Date   Allergic rhinitis    Allergy    Anemia 05/01/2018   HTN (hypertension)    No past surgical history on file. Allergies[1]    Objective:    Physical Exam Vitals and nursing note reviewed.  Constitutional:      General: He is not in acute distress.    Appearance: Normal appearance.  HENT:     Head: Normocephalic.  Cardiovascular:     Rate and Rhythm: Normal rate and regular rhythm.  Pulmonary:     Effort: Pulmonary effort is normal.     Breath sounds: Normal breath sounds.  Musculoskeletal:  General: Normal range of motion.     Cervical back: Normal range of motion.  Skin:    General: Skin is warm and dry.  Neurological:     Mental Status: He is alert and oriented to person, place, and time.  Psychiatric:        Mood and Affect: Mood normal.    BP 132/86 (BP Location: Left Arm, Patient Position: Sitting, Cuff Size: Large)   Pulse 84   Temp 98.2 F (36.8 C) (Temporal)   Wt 199  lb 3.2 oz (90.4 kg)   SpO2 95%   BMI 28.58 kg/m  Wt Readings from Last 3 Encounters:  10/06/24 199 lb 3.2 oz (90.4 kg)  04/06/24 196 lb 3.2 oz (89 kg)  10/07/23 190 lb (86.2 kg)      Kelissa Merlin, NP       [1] No Known Allergies

## 2025-04-06 ENCOUNTER — Encounter: Admitting: Family
